# Patient Record
Sex: Female | Born: 1960
Health system: Southern US, Community
[De-identification: ages and names within clinical notes are randomized; demographics above are authoritative.]

## PROBLEM LIST (undated history)

## (undated) DIAGNOSIS — G629 Polyneuropathy, unspecified: Secondary | ICD-10-CM

## (undated) DIAGNOSIS — E7800 Pure hypercholesterolemia, unspecified: Secondary | ICD-10-CM

## (undated) DIAGNOSIS — E119 Type 2 diabetes mellitus without complications: Secondary | ICD-10-CM

## (undated) DIAGNOSIS — I10 Essential (primary) hypertension: Secondary | ICD-10-CM

## (undated) DIAGNOSIS — E039 Hypothyroidism, unspecified: Secondary | ICD-10-CM

## (undated) HISTORY — PX: BREAST BIOPSY: SHX20

## (undated) HISTORY — PX: GALLBLADDER SURGERY: SHX652

## (undated) HISTORY — PX: ABDOMINAL HYSTERECTOMY: SHX81

## (undated) HISTORY — PX: BACK SURGERY: SHX140

## (undated) HISTORY — PX: EYE SURGERY: SHX253

---

## 2005-09-15 ENCOUNTER — Encounter: Admission: RE | Admit: 2005-09-15 | Discharge: 2005-09-15 | Payer: Self-pay | Admitting: Internal Medicine

## 2005-10-26 ENCOUNTER — Encounter: Admission: RE | Admit: 2005-10-26 | Discharge: 2005-10-26 | Payer: Self-pay | Admitting: Internal Medicine

## 2006-01-02 ENCOUNTER — Emergency Department (HOSPITAL_COMMUNITY): Admission: EM | Admit: 2006-01-02 | Discharge: 2006-01-02 | Payer: Self-pay | Admitting: Emergency Medicine

## 2006-02-01 ENCOUNTER — Other Ambulatory Visit: Admission: RE | Admit: 2006-02-01 | Discharge: 2006-02-01 | Payer: Self-pay | Admitting: Obstetrics and Gynecology

## 2006-02-07 ENCOUNTER — Ambulatory Visit (HOSPITAL_COMMUNITY): Admission: RE | Admit: 2006-02-07 | Discharge: 2006-02-07 | Payer: Self-pay | Admitting: Obstetrics and Gynecology

## 2006-03-31 ENCOUNTER — Encounter: Admission: RE | Admit: 2006-03-31 | Discharge: 2006-03-31 | Payer: Self-pay | Admitting: Internal Medicine

## 2006-08-30 ENCOUNTER — Emergency Department (HOSPITAL_COMMUNITY): Admission: EM | Admit: 2006-08-30 | Discharge: 2006-08-30 | Payer: Self-pay | Admitting: Emergency Medicine

## 2006-12-14 ENCOUNTER — Encounter: Admission: RE | Admit: 2006-12-14 | Discharge: 2006-12-14 | Payer: Self-pay | Admitting: Obstetrics and Gynecology

## 2006-12-19 ENCOUNTER — Encounter (INDEPENDENT_AMBULATORY_CARE_PROVIDER_SITE_OTHER): Payer: Self-pay | Admitting: Obstetrics and Gynecology

## 2006-12-20 ENCOUNTER — Inpatient Hospital Stay (HOSPITAL_COMMUNITY): Admission: RE | Admit: 2006-12-20 | Discharge: 2006-12-21 | Payer: Self-pay | Admitting: Obstetrics and Gynecology

## 2008-07-04 ENCOUNTER — Encounter: Admission: RE | Admit: 2008-07-04 | Discharge: 2008-07-04 | Payer: Self-pay | Admitting: Internal Medicine

## 2009-01-07 ENCOUNTER — Ambulatory Visit (HOSPITAL_COMMUNITY): Admission: RE | Admit: 2009-01-07 | Discharge: 2009-01-07 | Payer: Self-pay | Admitting: Ophthalmology

## 2009-06-07 ENCOUNTER — Emergency Department (HOSPITAL_COMMUNITY): Admission: EM | Admit: 2009-06-07 | Discharge: 2009-06-07 | Payer: Self-pay | Admitting: Emergency Medicine

## 2009-08-04 ENCOUNTER — Encounter: Admission: RE | Admit: 2009-08-04 | Discharge: 2009-09-10 | Payer: Self-pay | Admitting: Internal Medicine

## 2010-04-19 ENCOUNTER — Encounter: Payer: Self-pay | Admitting: Internal Medicine

## 2010-06-22 LAB — RPR: RPR Ser Ql: NONREACTIVE

## 2010-06-22 LAB — WET PREP, GENITAL: Trich, Wet Prep: NONE SEEN

## 2010-08-11 NOTE — Op Note (Signed)
Sarah Gallegos, Sarah Gallegos            ACCOUNT NO.:  [id number]   MEDICAL RECORD NO.:  [id number]          PATIENT TYPE:  OIB   LOCATION:  9304                          FACILITY:  [facility]   PHYSICIAN:  Osborn Coho, M.D.   DATE OF BIRTH:  12/07/1960   DATE OF PROCEDURE:  12/19/2006  DATE OF DISCHARGE:                               OPERATIVE REPORT   PREOPERATIVE DIAGNOSES:  1. Menometrorrhagia.  2. Dysmenorrhea.  3. Fibroids.   POSTOPERATIVE DIAGNOSES:  1. Menometrorrhagia.  2. Dysmenorrhea.  3. Fibroids.   PROCEDURE:  Total laparoscopic hysterectomy and cystoscopy.   ATTENDING:  Osborn Coho, M.D.   ASSISTANT:  Jaymes Graff, M.D.   ANESTHESIA:  General.   FINDINGS:  Uterus weighing greater than 250 grams   SPECIMENS TO PATHOLOGY:  Uterus and cervix.   FLUID:  3400 mL.   URINE OUTPUT:  700 mL.   ESTIMATED BLOOD LOSS:  500 mL.   COMPLICATIONS:  None.   PROCEDURE:  The patient was taken to the operating room after risks,  benefits, alternatives reviewed with the patient.  The patient  verbalized understanding and consent signed and witnessed.  The patient  was placed under general anesthesia and prepped and draped in normal  sterile fashion.  A weighted speculum and vaginal wall retractors were  placed into the vagina and while the patient was in the dorsal lithotomy  position, the cervix was grasped with single-tooth tenaculum on the  anterior lip and the uterus sounded to 9 cm.  The size 8 probe was  placed on the Rumi and the large 4.0 cm Koh ring was used as well.  The  Rumi was placed and balloon insufflated and the occluder insufflated as  well with a total of approximately 60 mL by the end of the case.  The  initial insufflation was with 30 mL.  Attention was then turned to the  abdomen where a 10 mm incision was made at the umbilicus and using  OptiVu, trocar was advanced into the intra-abdominal cavity.  Pneumoperitoneum was achieved.  Attention was then  turned to left lower  quadrant where a 5 mm incision was made and 5 mm trocar advanced under  direct visualization.  The same was done in the right lower quadrant and  10 mm incision made in the right lower quadrant as well and 10 mm trocar  advanced under direct visualization.  All incisions were injected with  Marcaine prior to making the incision.  Using the harmonic, the round  ligament on the right was coagulated and cut and the bladder flap  created.  The fallopian tube and the utero-ovarian pedicles were  coagulated as well and cut down to the level of the round ligament.  The  same was done with the left round ligament and the left fallopian tube  and utero-ovarian pedicle.  After the left round ligament was cut on  that side, the bladder flap was created as well.  The bladder was  dissected away on the anterior lower uterine segment and cervix and this  Koh ring identified and with the Gyrus spatula, the anterior cul-de-sac  was entered and extended to the right.  With retraction, the uterine  vessels on the right side were then cauterized with the bipolar Gyrus  and cut and the remainder of the tissue on that side was excised down to  level of the Koh ring with the spatula and taken posteriorly as well to  just beyond the midline.  The same was done on the patient's left.  The  vaginal cuff was repaired with 0 PDS via interrupted stitches using  approximately five stitches.  Good hemostasis was noted.  Cystoscopy was  performed and after indigo carmine was administered and bilaterally  efflux was noted.  The bivalve speculum was then placed in the patient's  vagina and small portion in the midline of the and vaginal cuff was  noted to require stitch which was then placed after changing gloves and  returning to the top.  Zero PDS was placed in the midportion of the  vaginal cuff.  There was small portion of oozing at the left angle which  was made hemostatic with Gelfoam.   Pneumoperitoneum was relieved and  pneumoperitoneum introduced once again and good hemostasis remained at  that site.  All pedicles were noted to be hemostatic.  Prior to placing  the Gelfoam, the intra-abdominal cavity was copiously irrigated.  All  trocars removed under direct visualization.  Pneumoperitoneum was  relieved.  The umbilical, suprapubic and right lower quadrant incisions  were repaired with 0 Vicryl on the fascia at the umbilicus and as deep  as possible in the right lower quadrant and on the fascia at the  umbilicus.  The umbilical incision was made in the midportion of the  case while going around the [place] ring in order to place the uterine  morcellator in the suprapubic port.  The uterus was morcellated in order  to see posteriorly and that is when the remainder of the [facility] ring was  exposed posteriorly and the posterior cuff excised.  After doing that  the uterus was able to be placed into the vagina and helped to maintain  pneumoperitoneum.  After closure of the umbilical suprapubic fascia and  the deep tissue in the right lower quadrant 10 mm incision.  Actually  the suprapubic incision was extended to approximately 12 to 15 mm.  This  was done in order to introduce the uterine morcellator.  The 5 mm left  lower quadrant and right lower quadrant ports were repaired with  Dermabond.  And all incisions were covered with Dermabond as well.  A  sponge, lap and needle count was correct.  The patient tolerated  procedure well and was awaiting transfer to the recovery room in good  condition.      Osborn Coho, M.D.  Electronically Signed     AR/MEDQ  D:  12/19/2006  T:  12/20/2006  Job:  [id number]

## 2010-08-11 NOTE — H&P (Signed)
Sarah Gallegos, Sarah Gallegos            ACCOUNT NO.:  [id number]   MEDICAL RECORD NO.:  [id number]          PATIENT TYPE:  AMB   LOCATION:  SDC                           [facility]:  [facility]   PHYSICIAN:  Osborn Coho, M.D.   DATE OF BIRTH:  [birth year]   DATE OF ADMISSION:  DATE OF DISCHARGE:                              HISTORY & PHYSICAL   CHIEF COMPLAINT:  Heavy prolonged menses with pain.   HISTORY OF PRESENT ILLNESS:  A 50 year old gravida 3, para 3 with last  menstrual period November 18, 2006, with complaints of heavy prolonged  cycles and pain with cycles.  The patient desires definitive management  at this time with hysterectomy.  The patient previously had a vaginal  myomectomy in 2005 and has also tried hormonal management.  Several  alternatives were discussed with the patient, but the patient continued  to want definitive management with a hysterectomy.   PAST OBSTETRICS HISTORY:  Normal spontaneous vaginal delivery times  three, largest infant weighing 8 pounds and 6 ounces.   PAST GYNECOLOGICAL HISTORY:  History of regular menses.  Recently has  had a month or two of skipped cycles.  However, periods have  been  mainly prolonged, heavy and with pain.  She denies a history of  gonorrhea, chlamydia, and last mammogram of last year was within normal  limits.   PAST MEDICAL HISTORY:  1. Diabetes.  2. Hypertension.  3. Hypercholesterolemia.  4. Elevated BMI.  5. Hypothyroidism.   PAST SURGICAL HISTORY:  1. Bilateral tubal ligation.  2. Laparoscopic cholecystectomy.  3. Vaginal myomectomy.  4. Back surgery.  5. Per primary medical doctor, left breast biopsy.  6. Endometrial ablation in [place] with ThermaChoice II.   MEDICATIONS:  1. Lipitor 20 mg one p.o. daily.  2. Nexium 40 mg one p.o. daily.  3. Actos 45 mg one p.o. daily.  4. Lexapro 20 mg on p.o. daily.  5. Ibuprofen 800 mg one p.o. t.i.d. p.r.n.  6. Darvocet N-100 one p.o. q.12 h p.r.n.  7. Glucovance  5/500 two p.o. b.i.d.  8. Zyrtec 10 mg one p.o. daily.  9. Nasacort AQ two sprays each nostril daily.  10.Lisinopril 40 mg one p.o. daily.  11.Synthroid 0.2 mg one p.o. daily.  12.Byetta 10 mcg subcu twice daily.  13.Hydrochlorothiazide 12.5 mg one p.o. daily.   ALLERGIES:  No known drug allergies.  Denies Latex allergy.   SOCIAL HISTORY:  Denies cigarettes use, alcohol use and drug use.  She  lives with her husband, daughter and granddaughter.   FAMILY HISTORY:  Mother with diabetes and congestive heart failure.   REVIEW OF SYSTEMS:  Denies chest pain and shortness of breath with  activity.  She walks with a cane secondary to pain in her right knee  with osteoarthritis (and may end up with knee replacement surgery).  She  denies any recent fevers, chills, nausea, vomiting, diarrhea, GI or GU  problems.   PHYSICAL EXAMINATION:  VITAL SIGNS:  Blood pressure 140/80, weight 336,  height 5 feet 8 inches, BMI 51.  HEENT:  Within normal limits.  Thyroid not enlarged.  HEART:  Rate and rhythm regular.  CHEST:  Clear to auscultation bilaterally.  BREAST:  No masses, discharge, skin changes or nipple retraction.  No  CVA tenderness.  ABDOMEN:  Soft, nontender.  EXTREMITIES:  Within normal limits.  PELVIC:  External genitalia within normal limits.  Uterus difficult to  palpate secondary to habitus and no palpable pelvic masses.   STUDIES:  Ultrasound October 13, 2006:  Uterus 10.8 x 7.6 x 5.3 cm.  Left  ovary within normal limits, right ovary within normal limits.  Multiple  fibroids noted.  There is a mid body fibroid that appears submucosal  measuring 1.9 x 1.7 cm.  There is an anterior fibroid measuring 2.0 x  1.7 x 1.9 cm, another anterior fibroid measuring 2.0 x 1.6 x 1.8 cm and  a posterior fibroid measuring 2.0 x 1.7 x 1.7 cm.  TSH on September 05, 2006,  was 1.105 within normal limits and hematocrit was 41.6.   ASSESSMENT/PLAN:  Ms. Rutan is a 50 year old para 3 with  menometrorrhagia  and dysmenorrhea desiring definitive management with  hysterectomy.  The risks, benefits and alternatives have been discussed  at length with the patient including but not limited to bleeding,  infection and injury as well as increased risk with postop course  secondary to BMI.  Medical clearance has been obtained from her primary  medical doctor who considers her to be at mild to moderate risk for  perioperative complications and cleared her for surgery.  The patient's  questions were answered, and the patient is scheduled for a total  laparoscopic hysterectomy, possible LAVH, possible TAH on December 19, 2006.      Osborn Coho, M.D.  Electronically Signed     AR/MEDQ  D:  12/19/2006  T:  12/19/2006  Job:  [id number]

## 2010-08-14 NOTE — Discharge Summary (Signed)
Sarah Gallegos, Sarah Gallegos            ACCOUNT NO.:  [id number]   MEDICAL RECORD NO.:  [id number]          PATIENT TYPE:  INP   LOCATION:  9304                          [facility]   PHYSICIAN:  Osborn Coho, M.D.   DATE OF BIRTH:  December 16, 1960   DATE OF ADMISSION:  12/19/2006  DATE OF DISCHARGE:  12/21/2006                               DISCHARGE SUMMARY   DISCHARGE DIAGNOSES:  1. Symptomatic uterine fibroids.  2. Menometrorrhagia.  3. Dysmenorrhea.   OPERATION:  On the date of admission, the patient underwent a total  laparoscopic hysterectomy with cystoscopy, tolerating the procedure  well.  The patient had her uterus along with the cervix removed weighing  a total of 250 grams.   HISTORY OF PRESENT ILLNESS:  Sarah Gallegos is a 50 year old, female, para 3-  0-0-3, who presents for hysterectomy because of symptomatic uterine  fibroids, menometrorrhagia and dysmenorrhea.  Please see the patient's  dictated history and physical examination for details.   PREOPERATIVE PHYSICAL EXAMINATION:  Blood pressure 140/80, weight is 336  pounds, height 5 feet 8 inches tall, BMI is 51.  GENERAL EXAM:  Is within normal limits.  PELVIC EXAM:  External genitalia within normal limits.  Uterus was  difficult to palpate secondary to body habitus, and there were no  palpable masses.   HOSPITAL COURSE:  On the day of admission, the patient underwent  aforementioned procedures, tolerating them all well.  Postoperative  course was marked by the patient having an episode of chest pain.  The  patient's cardiac enzymes returned elevated; however, her troponin I was  negative.  By postoperative day #2, the patient's symptoms had resolved.  She was tolerating a regular diet and voiding without difficulty and  therefore deemed ready for discharge home.  Discharge hemoglobin 10  (preoperative hemoglobin 12.3) and discharge basic metabolic panel was  within normal limits with the exception of the patient having  an  elevated glucose of 178 (the patient does have non-insulin-dependent  diabetes).   DISCHARGE MEDICATIONS:  1. Motrin 600 mg with food every 6 hours as needed for pain.  2. Percocet 5/325 one to two every 4 hours as needed for moderate to      severe pain.   FOLLOW UP:  The patient is to call [facility] at [phone]  to schedule a 6 weeks' postoperative visit with Dr. Su Hilt.   DISCHARGE INSTRUCTIONS:  The patient was given a copy of [facility] postoperative instruction sheet.  She was also advised  to call with any questions.  The patient was further advised to avoid  driving for 2 weeks, heavy lifting for 6 weeks (nothing greater than 10-  15 pounds), no intercourse for 6 weeks, that she may shower and she  should increase her activities slowly.  Her diet should be a diabetic  diet.   FINAL PATHOLOGY:  Uterus hysterectomy:  Uterine cervix - benign  ectocervical and endocervical mucosa with chronic endocervicitis;  uterine corpus - benign proliferative endometrium and intramural  leiomyomata.      [place] J. Adline Peals.      Marylene Land  Su Hilt, M.D.  Electronically Signed    EJP/MEDQ  D:  01/12/2007  T:  01/13/2007  Job:  [id number]

## 2010-09-09 ENCOUNTER — Emergency Department (HOSPITAL_COMMUNITY)
Admission: EM | Admit: 2010-09-09 | Discharge: 2010-09-10 | Disposition: A | Discharge status: Home / Self Care | Payer: Medicare PPO | Attending: Emergency Medicine | Admitting: Emergency Medicine

## 2010-09-09 DIAGNOSIS — R632 Polyphagia: Secondary | ICD-10-CM | POA: Insufficient documentation

## 2010-09-09 DIAGNOSIS — H109 Unspecified conjunctivitis: Secondary | ICD-10-CM | POA: Insufficient documentation

## 2010-09-09 DIAGNOSIS — Z79899 Other long term (current) drug therapy: Secondary | ICD-10-CM | POA: Insufficient documentation

## 2010-09-09 DIAGNOSIS — R631 Polydipsia: Secondary | ICD-10-CM | POA: Insufficient documentation

## 2010-09-09 DIAGNOSIS — E039 Hypothyroidism, unspecified: Secondary | ICD-10-CM | POA: Insufficient documentation

## 2010-09-09 DIAGNOSIS — H5789 Other specified disorders of eye and adnexa: Secondary | ICD-10-CM | POA: Insufficient documentation

## 2010-09-09 DIAGNOSIS — M79609 Pain in unspecified limb: Secondary | ICD-10-CM | POA: Insufficient documentation

## 2010-09-09 DIAGNOSIS — E119 Type 2 diabetes mellitus without complications: Secondary | ICD-10-CM | POA: Insufficient documentation

## 2010-09-09 DIAGNOSIS — H11419 Vascular abnormalities of conjunctiva, unspecified eye: Secondary | ICD-10-CM | POA: Insufficient documentation

## 2010-09-09 DIAGNOSIS — I10 Essential (primary) hypertension: Secondary | ICD-10-CM | POA: Insufficient documentation

## 2010-09-09 DIAGNOSIS — K219 Gastro-esophageal reflux disease without esophagitis: Secondary | ICD-10-CM | POA: Insufficient documentation

## 2010-09-09 DIAGNOSIS — Z9889 Other specified postprocedural states: Secondary | ICD-10-CM | POA: Insufficient documentation

## 2010-09-09 LAB — POCT I-STAT, CHEM 8
BUN: 9 mg/dL (ref 6–23)
Calcium, Ion: 1.16 mmol/L (ref 1.12–1.32)
Chloride: 99 mEq/L (ref 96–112)
HCT: 40 % (ref 36.0–46.0)
TCO2: 26 mmol/L (ref 0–100)

## 2010-09-09 LAB — CBC
Hemoglobin: 12.6 g/dL (ref 12.0–15.0)
MCH: 26.8 pg (ref 26.0–34.0)
MCV: 79.8 fL (ref 78.0–100.0)
Platelets: 338 10*3/uL (ref 150–400)
RBC: 4.7 MIL/uL (ref 3.87–5.11)
RDW: 13.4 % (ref 11.5–15.5)
WBC: 11.3 10*3/uL — ABNORMAL HIGH (ref 4.0–10.5)

## 2010-09-09 LAB — CK TOTAL AND CKMB (NOT AT ARMC)
CK, MB: 1.9 ng/mL (ref 0.3–4.0)
Relative Index: INVALID (ref 0.0–2.5)
Total CK: 81 U/L (ref 7–177)

## 2010-09-09 LAB — DIFFERENTIAL
Eosinophils Absolute: 0.1 10*3/uL (ref 0.0–0.7)
Eosinophils Relative: 1 % (ref 0–5)
Lymphs Abs: 3.5 10*3/uL (ref 0.7–4.0)
Monocytes Absolute: 0.8 10*3/uL (ref 0.1–1.0)
Neutro Abs: 6.9 10*3/uL (ref 1.7–7.7)
Neutrophils Relative %: 60 % (ref 43–77)

## 2010-09-10 LAB — URINALYSIS, ROUTINE W REFLEX MICROSCOPIC
Bilirubin Urine: NEGATIVE
Glucose, UA: >1000 mg/dL — AB
Hgb urine dipstick: NEGATIVE
Specific Gravity, Urine: 1.038 — ABNORMAL HIGH (ref 1.005–1.030)
pH: 5.5 (ref 5.0–8.0)

## 2010-09-10 LAB — GLUCOSE, CAPILLARY: Glucose-Capillary: 282 mg/dL — ABNORMAL HIGH (ref 70–99)

## 2010-09-10 LAB — URINE MICROSCOPIC-ADD ON

## 2011-01-07 ENCOUNTER — Other Ambulatory Visit: Payer: Self-pay | Admitting: Internal Medicine

## 2011-01-07 DIAGNOSIS — Z1231 Encounter for screening mammogram for malignant neoplasm of breast: Secondary | ICD-10-CM

## 2011-01-07 LAB — CBC
HCT: 29.6 — ABNORMAL LOW
Hemoglobin: 10 — ABNORMAL LOW
Hemoglobin: 12.3
RBC: 3.62 — ABNORMAL LOW
RBC: 4.46
RDW: 15.8 — ABNORMAL HIGH
RDW: 15.9 — ABNORMAL HIGH

## 2011-01-07 LAB — BASIC METABOLIC PANEL
CO2: 26
Calcium: 8.3 — ABNORMAL LOW
Calcium: 9.1
Creatinine, Ser: 0.99
GFR calc Af Amer: >60
GFR calc Af Amer: >60
GFR calc non Af Amer: 60 — ABNORMAL LOW
GFR calc non Af Amer: >60
Glucose, Bld: 178 — ABNORMAL HIGH
Potassium: 3.9
Sodium: 133 — ABNORMAL LOW
Sodium: 136

## 2011-01-07 LAB — CARDIAC PANEL(CRET KIN+CKTOT+MB+TROPI)
CK, MB: 40.5 — ABNORMAL HIGH
CK, MB: 49.7 — ABNORMAL HIGH
Total CK: 5458 — ABNORMAL HIGH
Total CK: 6020 — ABNORMAL HIGH
Troponin I: 0.03

## 2011-01-14 LAB — CBC
MCHC: 32.8
MCV: 79.7
Platelets: 360
RBC: 5.07
RDW: 14.9 — ABNORMAL HIGH

## 2011-01-14 LAB — COMPREHENSIVE METABOLIC PANEL
ALT: 16
AST: 14
Albumin: 3.5
Calcium: 9.3
Creatinine, Ser: 0.84
GFR calc Af Amer: >60
GFR calc non Af Amer: >60
Sodium: 138
Total Protein: 7

## 2011-01-14 LAB — DIFFERENTIAL
Eosinophils Absolute: 0.2
Eosinophils Relative: 1
Lymphocytes Relative: 25
Lymphs Abs: 3.9 — ABNORMAL HIGH
Monocytes Relative: 3

## 2011-01-14 LAB — LIPASE, BLOOD: Lipase: 22

## 2011-02-16 ENCOUNTER — Ambulatory Visit
Admission: RE | Admit: 2011-02-16 | Discharge: 2011-02-16 | Disposition: A | Discharge status: Home / Self Care | Payer: Medicare PPO | Source: Ambulatory Visit | Attending: Internal Medicine | Admitting: Internal Medicine

## 2011-02-16 DIAGNOSIS — Z1231 Encounter for screening mammogram for malignant neoplasm of breast: Secondary | ICD-10-CM

## 2011-06-08 ENCOUNTER — Other Ambulatory Visit: Payer: Self-pay | Admitting: Orthopedic Surgery

## 2011-07-07 ENCOUNTER — Encounter (HOSPITAL_BASED_OUTPATIENT_CLINIC_OR_DEPARTMENT_OTHER): Admission: RE | Payer: Self-pay | Source: Ambulatory Visit

## 2011-07-07 ENCOUNTER — Ambulatory Visit (HOSPITAL_BASED_OUTPATIENT_CLINIC_OR_DEPARTMENT_OTHER): Admission: RE | Admit: 2011-07-07 | Payer: Medicare PPO | Source: Ambulatory Visit | Admitting: Orthopedic Surgery

## 2011-07-07 SURGERY — CARPAL TUNNEL RELEASE
Anesthesia: Regional | Laterality: Right

## 2012-05-17 ENCOUNTER — Other Ambulatory Visit: Payer: Self-pay | Admitting: Internal Medicine

## 2012-05-22 ENCOUNTER — Ambulatory Visit: Payer: Medicare PPO

## 2012-06-01 ENCOUNTER — Ambulatory Visit
Admission: RE | Admit: 2012-06-01 | Discharge: 2012-06-01 | Disposition: A | Discharge status: Home / Self Care | Payer: Medicare PPO | Source: Ambulatory Visit | Attending: Internal Medicine | Admitting: Internal Medicine

## 2012-06-01 DIAGNOSIS — Z1231 Encounter for screening mammogram for malignant neoplasm of breast: Secondary | ICD-10-CM

## 2012-08-09 ENCOUNTER — Other Ambulatory Visit: Payer: Self-pay | Admitting: Gastroenterology

## 2012-09-04 ENCOUNTER — Other Ambulatory Visit: Payer: Self-pay | Admitting: Internal Medicine

## 2012-09-04 DIAGNOSIS — N6320 Unspecified lump in the left breast, unspecified quadrant: Secondary | ICD-10-CM

## 2012-09-18 ENCOUNTER — Ambulatory Visit
Admission: RE | Admit: 2012-09-18 | Discharge: 2012-09-18 | Disposition: A | Discharge status: Home / Self Care | Payer: Medicare PPO | Source: Ambulatory Visit | Attending: Internal Medicine | Admitting: Internal Medicine

## 2012-09-18 DIAGNOSIS — N6320 Unspecified lump in the left breast, unspecified quadrant: Secondary | ICD-10-CM

## 2012-10-11 ENCOUNTER — Other Ambulatory Visit: Payer: Self-pay | Admitting: Gastroenterology

## 2013-01-14 ENCOUNTER — Encounter (HOSPITAL_COMMUNITY): Payer: Self-pay | Admitting: Emergency Medicine

## 2013-01-14 ENCOUNTER — Emergency Department (HOSPITAL_COMMUNITY)
Admission: EM | Admit: 2013-01-14 | Discharge: 2013-01-14 | Disposition: A | Discharge status: Home / Self Care | Payer: Medicare PPO | Attending: Emergency Medicine | Admitting: Emergency Medicine

## 2013-01-14 DIAGNOSIS — Z794 Long term (current) use of insulin: Secondary | ICD-10-CM | POA: Insufficient documentation

## 2013-01-14 DIAGNOSIS — E119 Type 2 diabetes mellitus without complications: Secondary | ICD-10-CM | POA: Insufficient documentation

## 2013-01-14 DIAGNOSIS — Z79899 Other long term (current) drug therapy: Secondary | ICD-10-CM | POA: Insufficient documentation

## 2013-01-14 DIAGNOSIS — Y929 Unspecified place or not applicable: Secondary | ICD-10-CM | POA: Insufficient documentation

## 2013-01-14 DIAGNOSIS — E7800 Pure hypercholesterolemia, unspecified: Secondary | ICD-10-CM | POA: Insufficient documentation

## 2013-01-14 DIAGNOSIS — I10 Essential (primary) hypertension: Secondary | ICD-10-CM | POA: Insufficient documentation

## 2013-01-14 DIAGNOSIS — Y939 Activity, unspecified: Secondary | ICD-10-CM | POA: Insufficient documentation

## 2013-01-14 DIAGNOSIS — S139XXA Sprain of joints and ligaments of unspecified parts of neck, initial encounter: Secondary | ICD-10-CM | POA: Insufficient documentation

## 2013-01-14 DIAGNOSIS — Z792 Long term (current) use of antibiotics: Secondary | ICD-10-CM | POA: Insufficient documentation

## 2013-01-14 DIAGNOSIS — M25519 Pain in unspecified shoulder: Secondary | ICD-10-CM | POA: Insufficient documentation

## 2013-01-14 DIAGNOSIS — Z8669 Personal history of other diseases of the nervous system and sense organs: Secondary | ICD-10-CM | POA: Insufficient documentation

## 2013-01-14 DIAGNOSIS — X58XXXA Exposure to other specified factors, initial encounter: Secondary | ICD-10-CM | POA: Insufficient documentation

## 2013-01-14 DIAGNOSIS — S161XXA Strain of muscle, fascia and tendon at neck level, initial encounter: Secondary | ICD-10-CM

## 2013-01-14 HISTORY — DX: Essential (primary) hypertension: I10

## 2013-01-14 HISTORY — DX: Type 2 diabetes mellitus without complications: E11.9

## 2013-01-14 HISTORY — DX: Pure hypercholesterolemia, unspecified: E78.00

## 2013-01-14 HISTORY — DX: Polyneuropathy, unspecified: G62.9

## 2013-01-14 MED ORDER — HYDROMORPHONE HCL PF 2 MG/ML IJ SOLN
2.0000 mg | Freq: Once | INTRAMUSCULAR | Status: AC
  Administered 2013-01-14: 2 mg via INTRAMUSCULAR
  Filled 2013-01-14: qty 1

## 2013-01-14 MED ORDER — ONDANSETRON 4 MG PO TBDP
8.0000 mg | ORAL_TABLET | Freq: Once | ORAL | Status: AC
  Administered 2013-01-14: 8 mg via ORAL
  Filled 2013-01-14: qty 2

## 2013-01-14 MED ORDER — DEXAMETHASONE SODIUM PHOSPHATE 10 MG/ML IJ SOLN
10.0000 mg | Freq: Once | INTRAMUSCULAR | Status: AC
  Administered 2013-01-14: 10 mg via INTRAMUSCULAR
  Filled 2013-01-14: qty 1

## 2013-01-14 MED ORDER — ORPHENADRINE CITRATE ER 100 MG PO TB12: 100.0000 mg | ORAL_TABLET | Freq: Two times a day (BID) | ORAL | Status: DC

## 2013-01-14 NOTE — ED Notes (Signed)
Presents with 2 days of left sided neck pain causing headache and tension. Unable to move head to the left. Pt is crying and visibly uncomfortable. Has an appointment with ortho doc wednesday.  Alert, oriented, MAEx4.

## 2013-01-14 NOTE — ED Notes (Signed)
After exiting another room, this RN was informed that patient left department without receiving discharge information.

## 2013-01-14 NOTE — ED Notes (Signed)
Patient has not returned, Assumed to have left.

## 2013-01-14 NOTE — ED Notes (Addendum)
Answered patient call light. Patient upset because MD entered and told her that she would be discharged approximately 1hr prior. Currently no D/C order is placed. MD informed.

## 2013-01-14 NOTE — ED Provider Notes (Signed)
CSN: [id number]     Arrival date & time 01/14/13  1747 History   First MD Initiated Contact with Patient 01/14/13 1759     Chief Complaint  Patient presents with  . Neck Pain   (Consider location/radiation/quality/duration/timing/severity/associated sxs/prior Treatment) HPI Comments: Patient presents a left-sided neck pain. She states that 2 days ago she woke up with what felt like a crick in her neck. She states over the last 2 days has gotten worse and feels like she's having muscle spasms in the left side of her neck. The pain is worse when she tries to turn her head. She denies any radiation of the pain down her arm. She denies any numbness or weakness in her arm. She states when the pain gets worse it shoots up toward her head. She denies any ongoing head pain no dizziness. She denies any neck injuries or recent falls. She denies any prior neck problems. She does see an orthopedist at [facility] orthoped[facility] for ongoing pain in her left shoulder. The shoulder pain is unrelated to the neck per the patient. She has an upcoming appointment with the orthopedist on Wednesday.  Patient is a 52 y.o. female presenting with neck pain.  Neck Pain Associated symptoms: no chest pain, no fever, no headaches, no numbness and no weakness     Past Medical History  Diagnosis Date  . Diabetes mellitus without complication   . Hypertension   . Hypercholesteremia   . Neuropathy    Past Surgical History  Procedure Laterality Date  . Back surgery     History reviewed. No pertinent family history. History  Substance Use Topics  . Smoking status: Never Smoker   . Smokeless tobacco: Not on file  . Alcohol Use: No   OB History   Grav Para Term Preterm Abortions TAB SAB Ect Mult Living                 Review of Systems  Constitutional: Negative for fever, chills, diaphoresis and fatigue.  HENT: Negative for congestion, rhinorrhea and sneezing.   Eyes: Negative.   Respiratory: Negative for cough,  chest tightness and shortness of breath.   Cardiovascular: Negative for chest pain and leg swelling.  Gastrointestinal: Negative for nausea, vomiting, abdominal pain, diarrhea and blood in stool.  Genitourinary: Negative for frequency, hematuria, flank pain and difficulty urinating.  Musculoskeletal: Positive for neck pain. Negative for arthralgias and back pain.  Skin: Negative for rash.  Neurological: Negative for dizziness, speech difficulty, weakness, numbness and headaches.    Allergies  Lantus  Home Medications   Current Outpatient Rx  Name  Route  Sig  Dispense  Refill  . Dapagliflozin Propanediol (FARXIGA) 5 MG TABS   Oral   Take 5 mg by mouth daily.         Marland Kitchen doxycycline (VIBRA-TABS) 100 MG tablet   Oral   Take 100 mg by mouth 2 (two) times daily.         Marland Kitchen gabapentin (NEURONTIN) 300 MG capsule   Oral   Take 300 mg by mouth 3 (three) times daily.         Marland Kitchen glyBURIDE-metformin (GLUCOVANCE) 5-500 MG per tablet   Oral   Take 1 tablet by mouth 2 (two) times daily with a meal.         . hydrochlorothiazide (MICROZIDE) 12.5 MG capsule   Oral   Take 12.5 mg by mouth daily.         Marland Kitchen HYDROcodone-acetaminophen (NORCO/VICODIN) 5-325 MG per  tablet   Oral   Take 1 tablet by mouth 2 (two) times daily.         . insulin lispro (HUMALOG) 100 UNIT/ML injection   Subcutaneous   Inject 20-30 Units into the skin 2 (two) times daily.         Marland Kitchen levothyroxine (SYNTHROID, LEVOTHROID) 200 MCG tablet   Oral   Take 200 mcg by mouth daily before breakfast.         . lisinopril (PRINIVIL,ZESTRIL) 40 MG tablet   Oral   Take 40 mg by mouth daily.         . simvastatin (ZOCOR) 20 MG tablet   Oral   Take 20 mg by mouth daily.         Marland Kitchen zolpidem (AMBIEN) 10 MG tablet   Oral   Take 10 mg by mouth at bedtime as needed for sleep.         . orphenadrine (NORFLEX) 100 MG tablet   Oral   Take 1 tablet (100 mg total) by mouth 2 (two) times daily.   20 tablet    0    BP 183/113  Pulse 95  Temp(Src) 98.4 F (36.9 C) (Oral)  Resp 18  SpO2 97% Physical Exam  Constitutional: She is oriented to person, place, and time. She appears well-developed and well-nourished.  HENT:  Head: Normocephalic and atraumatic.  Eyes: Pupils are equal, round, and reactive to light.  Neck:  Patient has muscle spasms in the left side of her neck. She has pain over the left sternocleidomastoid muscle in the left paraspinal muscles. She has no spinal tenderness along the cervical spine.  Cardiovascular: Normal rate, regular rhythm and normal heart sounds.   Pulmonary/Chest: Effort normal and breath sounds normal. No respiratory distress. She has no wheezes. She has no rales. She exhibits no tenderness.  Abdominal: Soft. Bowel sounds are normal. There is no tenderness. There is no rebound and no guarding.  Musculoskeletal: Normal range of motion. She exhibits no edema.  Lymphadenopathy:    She has no cervical adenopathy.  Neurological: She is alert and oriented to person, place, and time.  Patient has normal sensation and motor function in the left arm. She has normal pulses in the arms.  Skin: Skin is warm and dry. No rash noted.  Psychiatric: She has a normal mood and affect.    ED Course  Procedures (including critical care time) Labs Review Labs Reviewed - No data to display Imaging Review No results found.  EKG Interpretation   None       MDM   1. Neck strain, initial encounter    Patient neck pain. She has no bony tenderness. There's no neurologic deficits. Her pain was significantly improved with a shot of Dilaudid and Decadron. Given her diabetes I advised her she is going to keep a close eye on her blood sugars given that she was given a dose of steroids. She acknowledges this. She has appointment to follow up with orthopedist on Wednesday. She has Vicodin to take at home and I did give her prescription for Norflex as well.    Rolan Bucco,  MD 01/14/13 2100

## 2013-01-23 ENCOUNTER — Other Ambulatory Visit: Payer: Self-pay | Admitting: Orthopedic Surgery

## 2013-01-23 DIAGNOSIS — R531 Weakness: Secondary | ICD-10-CM

## 2013-01-23 DIAGNOSIS — M25512 Pain in left shoulder: Secondary | ICD-10-CM

## 2013-01-31 ENCOUNTER — Other Ambulatory Visit: Payer: Medicare Other

## 2013-02-06 ENCOUNTER — Ambulatory Visit
Admission: RE | Admit: 2013-02-06 | Discharge: 2013-02-06 | Disposition: A | Discharge status: Home / Self Care | Payer: Medicare PPO | Source: Ambulatory Visit | Attending: Orthopedic Surgery | Admitting: Orthopedic Surgery

## 2013-02-06 DIAGNOSIS — R531 Weakness: Secondary | ICD-10-CM

## 2013-02-06 DIAGNOSIS — M25512 Pain in left shoulder: Secondary | ICD-10-CM

## 2013-02-13 ENCOUNTER — Ambulatory Visit
Admission: RE | Admit: 2013-02-13 | Discharge: 2013-02-13 | Disposition: A | Discharge status: Home / Self Care | Payer: Medicare PPO | Source: Ambulatory Visit | Attending: Orthopedic Surgery | Admitting: Orthopedic Surgery

## 2013-07-02 ENCOUNTER — Other Ambulatory Visit: Payer: Self-pay

## 2013-07-02 DIAGNOSIS — Z1231 Encounter for screening mammogram for malignant neoplasm of breast: Secondary | ICD-10-CM

## 2013-07-26 ENCOUNTER — Encounter (INDEPENDENT_AMBULATORY_CARE_PROVIDER_SITE_OTHER): Payer: Self-pay

## 2013-07-26 ENCOUNTER — Ambulatory Visit
Admission: RE | Admit: 2013-07-26 | Discharge: 2013-07-26 | Disposition: A | Discharge status: Home / Self Care | Payer: Medicare PPO | Source: Ambulatory Visit

## 2013-07-26 DIAGNOSIS — Z1231 Encounter for screening mammogram for malignant neoplasm of breast: Secondary | ICD-10-CM

## 2013-08-27 ENCOUNTER — Other Ambulatory Visit (HOSPITAL_COMMUNITY): Payer: Self-pay | Admitting: Internal Medicine

## 2013-08-27 ENCOUNTER — Ambulatory Visit (HOSPITAL_COMMUNITY)
Admission: RE | Admit: 2013-08-27 | Discharge: 2013-08-27 | Disposition: A | Discharge status: Home / Self Care | Payer: Medicare PPO | Source: Ambulatory Visit | Attending: Internal Medicine | Admitting: Internal Medicine

## 2013-08-27 DIAGNOSIS — J209 Acute bronchitis, unspecified: Secondary | ICD-10-CM | POA: Insufficient documentation

## 2013-09-23 ENCOUNTER — Encounter (HOSPITAL_COMMUNITY): Payer: Self-pay | Admitting: Emergency Medicine

## 2013-09-23 ENCOUNTER — Emergency Department (HOSPITAL_COMMUNITY): Payer: Medicare PPO

## 2013-09-23 ENCOUNTER — Emergency Department (HOSPITAL_COMMUNITY)
Admission: EM | Admit: 2013-09-23 | Discharge: 2013-09-23 | Disposition: A | Discharge status: Home / Self Care | Payer: Medicare PPO | Attending: Emergency Medicine | Admitting: Emergency Medicine

## 2013-09-23 DIAGNOSIS — M7989 Other specified soft tissue disorders: Secondary | ICD-10-CM

## 2013-09-23 DIAGNOSIS — Z794 Long term (current) use of insulin: Secondary | ICD-10-CM | POA: Diagnosis not present

## 2013-09-23 DIAGNOSIS — R0602 Shortness of breath: Secondary | ICD-10-CM

## 2013-09-23 DIAGNOSIS — Z79899 Other long term (current) drug therapy: Secondary | ICD-10-CM | POA: Insufficient documentation

## 2013-09-23 DIAGNOSIS — R609 Edema, unspecified: Secondary | ICD-10-CM | POA: Diagnosis not present

## 2013-09-23 DIAGNOSIS — E119 Type 2 diabetes mellitus without complications: Secondary | ICD-10-CM | POA: Diagnosis not present

## 2013-09-23 DIAGNOSIS — E7800 Pure hypercholesterolemia, unspecified: Secondary | ICD-10-CM | POA: Diagnosis not present

## 2013-09-23 DIAGNOSIS — I10 Essential (primary) hypertension: Secondary | ICD-10-CM | POA: Diagnosis not present

## 2013-09-23 DIAGNOSIS — G609 Hereditary and idiopathic neuropathy, unspecified: Secondary | ICD-10-CM | POA: Insufficient documentation

## 2013-09-23 LAB — CBC WITH DIFFERENTIAL/PLATELET
BASOS ABS: 0.1 10*3/uL (ref 0.0–0.1)
Basophils Relative: 0 % (ref 0–1)
EOS ABS: 0.2 10*3/uL (ref 0.0–0.7)
EOS PCT: 2 % (ref 0–5)
HCT: 39.2 % (ref 36.0–46.0)
Hemoglobin: 12.6 g/dL (ref 12.0–15.0)
Lymphocytes Relative: 37 % (ref 12–46)
Lymphs Abs: 4.3 10*3/uL — ABNORMAL HIGH (ref 0.7–4.0)
MCH: 26.4 pg (ref 26.0–34.0)
MCHC: 32.1 g/dL (ref 30.0–36.0)
MCV: 82 fL (ref 78.0–100.0)
Monocytes Absolute: 0.7 10*3/uL (ref 0.1–1.0)
Monocytes Relative: 6 % (ref 3–12)
Neutro Abs: 6.3 10*3/uL (ref 1.7–7.7)
Neutrophils Relative %: 55 % (ref 43–77)
PLATELETS: 342 10*3/uL (ref 150–400)
RBC: 4.78 MIL/uL (ref 3.87–5.11)
RDW: 13.9 % (ref 11.5–15.5)
WBC: 11.5 10*3/uL — AB (ref 4.0–10.5)

## 2013-09-23 LAB — URINALYSIS, ROUTINE W REFLEX MICROSCOPIC
Bilirubin Urine: NEGATIVE
GLUCOSE, UA: NEGATIVE mg/dL
HGB URINE DIPSTICK: NEGATIVE
Ketones, ur: NEGATIVE mg/dL
Nitrite: NEGATIVE
Protein, ur: 30 mg/dL — AB
SPECIFIC GRAVITY, URINE: 1.016 (ref 1.005–1.030)
UROBILINOGEN UA: 0.2 mg/dL (ref 0.0–1.0)
pH: 5 (ref 5.0–8.0)

## 2013-09-23 LAB — COMPREHENSIVE METABOLIC PANEL
ALT: 15 U/L (ref 0–35)
AST: 13 U/L (ref 0–37)
Albumin: 3.4 g/dL — ABNORMAL LOW (ref 3.5–5.2)
Alkaline Phosphatase: 72 U/L (ref 39–117)
BUN: 13 mg/dL (ref 6–23)
CALCIUM: 9.4 mg/dL (ref 8.4–10.5)
CO2: 25 mEq/L (ref 19–32)
Chloride: 101 mEq/L (ref 96–112)
Creatinine, Ser: 0.8 mg/dL (ref 0.50–1.10)
GFR calc non Af Amer: 83 mL/min — ABNORMAL LOW (ref >90)
Glucose, Bld: 79 mg/dL (ref 70–99)
Potassium: 3.4 mEq/L — ABNORMAL LOW (ref 3.7–5.3)
Sodium: 141 mEq/L (ref 137–147)
TOTAL PROTEIN: 7.3 g/dL (ref 6.0–8.3)
Total Bilirubin: <0.2 mg/dL — ABNORMAL LOW (ref 0.3–1.2)

## 2013-09-23 LAB — URINE MICROSCOPIC-ADD ON

## 2013-09-23 LAB — PRO B NATRIURETIC PEPTIDE: Pro B Natriuretic peptide (BNP): 66.9 pg/mL (ref 0–125)

## 2013-09-23 LAB — TSH: TSH: 1.62 u[IU]/mL (ref 0.350–4.500)

## 2013-09-23 LAB — T4, FREE: FREE T4: 1.32 ng/dL (ref 0.80–1.80)

## 2013-09-23 MED ORDER — FUROSEMIDE 10 MG/ML IJ SOLN: 40.0000 mg | Freq: Once | INTRAMUSCULAR | Status: DC

## 2013-09-23 MED ORDER — FUROSEMIDE 10 MG/ML IJ SOLN
40.0000 mg | Freq: Once | INTRAMUSCULAR | Status: AC
  Administered 2013-09-23: 40 mg via INTRAMUSCULAR
  Filled 2013-09-23: qty 4

## 2013-09-23 MED ORDER — FLUCONAZOLE 100 MG PO TABS
150.0000 mg | ORAL_TABLET | Freq: Once | ORAL | Status: AC
  Administered 2013-09-23: 150 mg via ORAL
  Filled 2013-09-23: qty 2

## 2013-09-23 NOTE — Discharge Instructions (Signed)
Edema Follow up with the wellness center on Monday. Return to the ED if you develop chest pain, Shortness of breath or any other concerns. Edema is an abnormal buildup of fluids in your bodytissues. Edema is somewhatdependent on gravity to pull the fluid to the lowest place in your body. That makes the condition more common in the legs and thighs (lower extremities). Painless swelling of the feet and ankles is common and becomes more likely as you get older. It is also common in looser tissues, like around your eyes.  When the affected area is squeezed, the fluid may move out of that spot and leave a dent for a few moments. This dent is called pitting.  CAUSES  There are many possible causes of edema. Eating too much salt and being on your feet or sitting for a long time can cause edema in your legs and ankles. Hot weather may make edema worse. Common medical causes of edema include:  Heart failure.  Liver disease.  Kidney disease.  Weak blood vessels in your legs.  Cancer.  An injury.  Pregnancy.  Some medications.  Obesity. SYMPTOMS  Edema is usually painless.Your skin may look swollen or shiny.  DIAGNOSIS  Your health care provider may be able to diagnose edema by asking about your medical history and doing a physical exam. You may need to have tests such as X-rays, an electrocardiogram, or blood tests to check for medical conditions that may cause edema.  TREATMENT  Edema treatment depends on the cause. If you have heart, liver, or kidney disease, you need the treatment appropriate for these conditions. General treatment may include:  Elevation of the affected body part above the level of your heart.  Compression of the affected body part. Pressure from elastic bandages or support stockings squeezes the tissues and forces fluid back into the blood vessels. This keeps fluid from entering the tissues.  Restriction of fluid and salt intake.  Use of a water pill (diuretic).  These medications are appropriate only for some types of edema. They pull fluid out of your body and make you urinate more often. This gets rid of fluid and reduces swelling, but diuretics can have side effects. Only use diuretics as directed by your health care provider. HOME CARE INSTRUCTIONS   Keep the affected body part above the level of your heart when you are lying down.   Do not sit still or stand for prolonged periods.   Do not put anything directly under your knees when lying down.  Do not wear constricting clothing or garters on your upper legs.   Exercise your legs to work the fluid back into your blood vessels. This may help the swelling go down.   Wear elastic bandages or support stockings to reduce ankle swelling as directed by your health care provider.   Eat a low-salt diet to reduce fluid if your health care provider recommends it.   Only take medicines as directed by your health care provider. SEEK MEDICAL CARE IF:   Your edema is not responding to treatment.  You have heart, liver, or kidney disease and notice symptoms of edema.  You have edema in your legs that does not improve after elevating them.   You have sudden and unexplained weight gain. SEEK IMMEDIATE MEDICAL CARE IF:   You develop shortness of breath or chest pain.   You cannot breathe when you lie down.  You develop pain, redness, or warmth in the swollen areas.   You  have heart, liver, or kidney disease and suddenly get edema.  You have a fever and your symptoms suddenly get worse. MAKE SURE YOU:   Understand these instructions.  Will watch your condition.  Will get help right away if you are not doing well or get worse. Document Released: 03/15/2005 Document Revised: 03/20/2013 Document Reviewed: 01/05/2013 [facility] Patient Information 2015 [place], [place]. This information is not intended to replace advice given to you by your health care provider. Make sure you discuss any  questions you have with your health care provider.

## 2013-09-23 NOTE — ED Notes (Signed)
Pt has returned from the bathroom; placed back on monitor, continuous pulse oximetry and blood pressure cuff; urine collected and sent to lab for testing

## 2013-09-23 NOTE — ED Notes (Signed)
[place], MD at bedside. 

## 2013-09-23 NOTE — ED Notes (Signed)
Patient transported to X-ray 

## 2013-09-23 NOTE — ED Provider Notes (Signed)
CSN: [id number]     Arrival date & time 09/23/13  4854 History   First MD Initiated Contact with Patient 09/23/13 [phone]     Chief Complaint  Patient presents with  . Leg Swelling     (Consider location/radiation/quality/duration/timing/severity/associated sxs/prior Treatment) HPI Comments: Patient presents with a ten-day history of bilateral leg swelling is progressively worsening. The left feels worse than the right and is painful. She denies any chest pain or shortness of breath. She denies any fever or cough. Denies any history of heart failure. She has a history of diabetes, hypertension, hypothyroidism on Synthroid. States compliance with medications. She is on hydrochlorothiazide. She feels like she is having worsening diabetic neuropathy pain in her legs. Denies abdominal pain or back pain. She did take a recent car trip to [place].  The history is provided by the patient.    Past Medical History  Diagnosis Date  . Diabetes mellitus without complication   . Hypertension   . Hypercholesteremia   . Neuropathy    Past Surgical History  Procedure Laterality Date  . Back surgery     No family history on file. History  Substance Use Topics  . Smoking status: Never Smoker   . Smokeless tobacco: Not on file  . Alcohol Use: No   OB History   Grav Para Term Preterm Abortions TAB SAB Ect Mult Living                 Review of Systems  Constitutional: Negative for fever, activity change and appetite change.  HENT: Negative for congestion and rhinorrhea.   Respiratory: Negative for cough, chest tightness and shortness of breath.   Cardiovascular: Positive for leg swelling. Negative for chest pain.  Gastrointestinal: Negative for nausea, vomiting and abdominal pain.  Genitourinary: Negative for dysuria and hematuria.  Musculoskeletal: Positive for arthralgias and myalgias. Negative for joint swelling.  Skin: Negative for rash.  Neurological: Negative for dizziness, weakness  and headaches.  A complete 10 system review of systems was obtained and all systems are negative except as noted in the HPI and PMH.      Allergies  Lantus and Tradjenta  Home Medications   Prior to Admission medications   Medication Sig Start Date End Date Taking? Authorizing Provider  cycloSPORINE (RESTASIS) 0.05 % ophthalmic emulsion Place 1 drop into both eyes daily as needed (dry eyes).   Yes Historical Provider, MD  Dapagliflozin Propanediol (FARXIGA) 5 MG TABS Take 5 mg by mouth daily.   Yes Historical Provider, MD  gabapentin (NEURONTIN) 300 MG capsule Take 300 mg by mouth 3 (three) times daily.   Yes Historical Provider, MD  glyBURIDE-metformin (GLUCOVANCE) 5-500 MG per tablet Take 2 tablets by mouth 2 (two) times daily with a meal.    Yes Historical Provider, MD  hydrochlorothiazide (MICROZIDE) 12.5 MG capsule Take 12.5 mg by mouth daily.   Yes Historical Provider, MD  HYDROcodone-acetaminophen (NORCO/VICODIN) 5-325 MG per tablet Take 1 tablet by mouth 2 (two) times daily as needed for moderate pain.    Yes Historical Provider, MD  Insulin Lispro Prot & Lispro (HUMALOG MIX 75/25 KWIKPEN) (75-25) 100 UNIT/ML Kwikpen Inject 20-30 Units into the skin 2 (two) times daily. 30 units and 20 units at night   Yes Historical Provider, MD  levothyroxine (SYNTHROID, LEVOTHROID) 200 MCG tablet Take 200 mcg by mouth daily before breakfast.   Yes Historical Provider, MD  lisinopril (PRINIVIL,ZESTRIL) 40 MG tablet Take 40 mg by mouth daily.   Yes Historical  Provider, MD  naproxen sodium (ANAPROX) 220 MG tablet Take 440 mg by mouth daily as needed (pain).   Yes Historical Provider, MD  simvastatin (ZOCOR) 20 MG tablet Take 20 mg by mouth daily.   Yes Historical Provider, MD  zolpidem (AMBIEN) 10 MG tablet Take 10 mg by mouth at bedtime as needed for sleep.   Yes Historical Provider, MD  insulin lispro (HUMALOG) 100 UNIT/ML injection Inject 20-30 Units into the skin 2 (two) times daily.     Historical Provider, MD   BP 141/64  Pulse 73  Temp(Src) 98.5 F (36.9 C) (Oral)  Resp 20  Ht 5\' 8"  (1.727 m)  Wt 287 lb (130.182 kg)  BMI 43.65 kg/m2  SpO2 100% Physical Exam  Nursing note and vitals reviewed. Constitutional: She is oriented to person, place, and time. She appears well-developed and well-nourished. No distress.  HENT:  Head: Normocephalic and atraumatic.  Mouth/Throat: Oropharynx is clear and moist. No oropharyngeal exudate.  Eyes: Conjunctivae and EOM are normal. Pupils are equal, round, and reactive to light.  Neck: Normal range of motion. Neck supple.  No meningismus.  Cardiovascular: Normal rate, regular rhythm, normal heart sounds and intact distal pulses.   No murmur heard. Pulmonary/Chest: Effort normal and breath sounds normal. No respiratory distress. She has no rales.  Abdominal: Soft. There is no tenderness. There is no rebound and no guarding.  Musculoskeletal: Normal range of motion. She exhibits edema. She exhibits no tenderness.  +2 pretibial pitting edema to knees bilaterally. Unable to palpate pulses. +2 DP pulses with doppler. No calf asymmetry  Neurological: She is alert and oriented to person, place, and time. No cranial nerve deficit. She exhibits normal muscle tone. Coordination normal.  No ataxia on finger to nose bilaterally. No pronator drift. 5/5 strength throughout. CN 2-12 intact. Negative Romberg. Equal grip strength. Sensation intact. Gait is normal.   Skin: Skin is warm.  Psychiatric: She has a normal mood and affect. Her behavior is normal.    ED Course  Procedures (including critical care time) Labs Review Labs Reviewed  CBC WITH DIFFERENTIAL - Abnormal; Notable for the following:    WBC 11.5 (*)    Lymphs Abs 4.3 (*)    All other components within normal limits  COMPREHENSIVE METABOLIC PANEL - Abnormal; Notable for the following:    Potassium 3.4 (*)    Albumin 3.4 (*)    Total Bilirubin <0.2 (*)    GFR calc non Af  Amer 83 (*)    All other components within normal limits  URINALYSIS, ROUTINE W REFLEX MICROSCOPIC - Abnormal; Notable for the following:    Protein, ur 30 (*)    Leukocytes, UA SMALL (*)    All other components within normal limits  URINE MICROSCOPIC-ADD ON - Abnormal; Notable for the following:    Squamous Epithelial / LPF FEW (*)    Bacteria, UA FEW (*)    All other components within normal limits  PRO B NATRIURETIC PEPTIDE  TSH  T4, FREE    Imaging Review Dg Chest 2 View  09/23/2013   CLINICAL DATA:  LEG SWELLING  EXAM: CHEST - 2 VIEW  COMPARISON:  08/27/2013  FINDINGS: Persistent perihilar interstitial prominence. No focal airspace disease. Heart size normal. Vascular clips in the upper abdomen. No effusion. Visualized skeletal structures are unremarkable.  IMPRESSION: No acute cardiopulmonary disease.   Electronically Signed   By: Arne Cleveland M.D.   On: 09/23/2013 11:08     EKG Interpretation  Date/Time:  Sunday September 23 2013 10:08:06 EDT Ventricular Rate:  69 PR Interval:  171 QRS Duration: 86 QT Interval:  406 QTC Calculation: 435 R Axis:   -22 Text Interpretation:  Sinus rhythm Borderline left axis deviation  Nonspecific T abnormalities, lateral leads lateral T wave inversions  Confirmed by Makensey Rego  MD, Esker Dever ([id number]) on 09/23/2013 10:21:57 AM      MDM   Final diagnoses:  Leg swelling   Leg swelling for the past 10 days. No chest pain or shortness of breath. History of hypothyroidism, no history of CHF.  02 saturation 100%. Lungs are clear to auscultation. Will obtain dopplers given recent car travel.   Dopplers were negative for DVT. Chest x-ray negative. Kidney function and liver function normal. TSH within normal limits.  Patient given dose of IV Lasix in ED. She is ambulate without desaturation. She stable for followup as an outpatient with recheck of her leg edema. Patient instructed her TSH is normal but thyroid hormone is still pending. She will  follow with her Dr. this week for adjustment of her diuretics. No evidence of CHF exacerbation. No hypoxia  BP 141/64  Pulse 73  Temp(Src) 98.5 F (36.9 C) (Oral)  Resp 20  Ht 5\' 8"  (1.727 m)  Wt 287 lb (130.182 kg)  BMI 43.65 kg/m2  SpO2 100%   Ezequiel Essex, MD 09/23/13 1607

## 2013-09-23 NOTE — ED Notes (Signed)
Pt ambulated well stating 97% - 98%RA with a heart rate of 86 - 88bpm without difficulty or distress

## 2013-09-23 NOTE — ED Notes (Signed)
Pt still out of the department at this time for testing

## 2013-09-23 NOTE — ED Notes (Signed)
Pt given a [place] sandwich and a cup of ice with 2 cranberry juices to drink; Abigail Butts, RN in room with pt

## 2013-09-23 NOTE — Progress Notes (Signed)
VASCULAR LAB PRELIMINARY  PRELIMINARY  PRELIMINARY  PRELIMINARY  Bilateral lower extremity venous Dopplers completed.    Preliminary report:  There is no DVT or SVT noted in the bilateral lower extremities.   KANADY, CANDACE, RVT 09/23/2013, 9:45 AM

## 2013-09-23 NOTE — ED Notes (Signed)
Pt aware of need of urine specimen; pt up ambulatory at this time to attempt to provide an urine specimen

## 2013-09-23 NOTE — ED Notes (Signed)
Pt has returned from being out of the department; pt placed back on monitor, continuous pulse oximetry and blood pressure cuff; EKG performed

## 2013-09-23 NOTE — ED Notes (Signed)
Pt c/o bil leg swelling onset June 18, worsening with pain,pt reports burning sensation bil lower extremities, denies CP, pt c/o intermittent SOB worse with activity, A&O x4

## 2014-04-15 ENCOUNTER — Ambulatory Visit
Admission: RE | Admit: 2014-04-15 | Discharge: 2014-04-15 | Disposition: A | Discharge status: Home / Self Care | Payer: Medicare PPO | Source: Ambulatory Visit | Attending: Internal Medicine | Admitting: Internal Medicine

## 2014-04-15 ENCOUNTER — Other Ambulatory Visit: Payer: Self-pay | Admitting: Internal Medicine

## 2014-04-15 DIAGNOSIS — J209 Acute bronchitis, unspecified: Secondary | ICD-10-CM

## 2014-06-07 DIAGNOSIS — I10 Essential (primary) hypertension: Secondary | ICD-10-CM | POA: Diagnosis not present

## 2014-06-07 DIAGNOSIS — E784 Other hyperlipidemia: Secondary | ICD-10-CM | POA: Diagnosis not present

## 2014-06-07 DIAGNOSIS — E1140 Type 2 diabetes mellitus with diabetic neuropathy, unspecified: Secondary | ICD-10-CM | POA: Diagnosis not present

## 2014-06-07 DIAGNOSIS — L84 Corns and callosities: Secondary | ICD-10-CM | POA: Diagnosis not present

## 2014-07-31 ENCOUNTER — Emergency Department (HOSPITAL_COMMUNITY)
Admission: EM | Admit: 2014-07-31 | Discharge: 2014-07-31 | Disposition: A | Discharge status: Home / Self Care | Payer: Medicare PPO | Attending: Emergency Medicine | Admitting: Emergency Medicine

## 2014-07-31 ENCOUNTER — Emergency Department (HOSPITAL_COMMUNITY): Payer: Medicare PPO

## 2014-07-31 ENCOUNTER — Encounter (HOSPITAL_COMMUNITY): Payer: Self-pay | Admitting: Emergency Medicine

## 2014-07-31 DIAGNOSIS — J189 Pneumonia, unspecified organism: Secondary | ICD-10-CM

## 2014-07-31 DIAGNOSIS — Z79899 Other long term (current) drug therapy: Secondary | ICD-10-CM | POA: Insufficient documentation

## 2014-07-31 DIAGNOSIS — Z794 Long term (current) use of insulin: Secondary | ICD-10-CM | POA: Diagnosis not present

## 2014-07-31 DIAGNOSIS — R110 Nausea: Secondary | ICD-10-CM | POA: Insufficient documentation

## 2014-07-31 DIAGNOSIS — E780 Pure hypercholesterolemia: Secondary | ICD-10-CM | POA: Diagnosis not present

## 2014-07-31 DIAGNOSIS — I10 Essential (primary) hypertension: Secondary | ICD-10-CM | POA: Insufficient documentation

## 2014-07-31 DIAGNOSIS — Z8669 Personal history of other diseases of the nervous system and sense organs: Secondary | ICD-10-CM | POA: Diagnosis not present

## 2014-07-31 DIAGNOSIS — J159 Unspecified bacterial pneumonia: Secondary | ICD-10-CM | POA: Diagnosis not present

## 2014-07-31 DIAGNOSIS — R079 Chest pain, unspecified: Secondary | ICD-10-CM | POA: Diagnosis present

## 2014-07-31 DIAGNOSIS — E119 Type 2 diabetes mellitus without complications: Secondary | ICD-10-CM | POA: Insufficient documentation

## 2014-07-31 LAB — CBC WITH DIFFERENTIAL/PLATELET
Basophils Absolute: 0 10*3/uL (ref 0.0–0.1)
Basophils Relative: 0 % (ref 0–1)
Eosinophils Absolute: 0.2 10*3/uL (ref 0.0–0.7)
Eosinophils Relative: 1 % (ref 0–5)
HCT: 41.5 % (ref 36.0–46.0)
Hemoglobin: 13.8 g/dL (ref 12.0–15.0)
Lymphocytes Relative: 16 % (ref 12–46)
Lymphs Abs: 2 10*3/uL (ref 0.7–4.0)
MCH: 27.4 pg (ref 26.0–34.0)
MCHC: 33.3 g/dL (ref 30.0–36.0)
MCV: 82.5 fL (ref 78.0–100.0)
Monocytes Absolute: 0.9 10*3/uL (ref 0.1–1.0)
Monocytes Relative: 7 % (ref 3–12)
Neutro Abs: 9.5 10*3/uL — ABNORMAL HIGH (ref 1.7–7.7)
Neutrophils Relative %: 76 % (ref 43–77)
Platelets: 337 10*3/uL (ref 150–400)
RBC: 5.03 MIL/uL (ref 3.87–5.11)
RDW: 13.7 % (ref 11.5–15.5)
WBC: 12.6 10*3/uL — ABNORMAL HIGH (ref 4.0–10.5)

## 2014-07-31 LAB — BASIC METABOLIC PANEL
Anion gap: 13 (ref 5–15)
BUN: 17 mg/dL (ref 6–20)
CO2: 26 mmol/L (ref 22–32)
Calcium: 9.5 mg/dL (ref 8.9–10.3)
Chloride: 94 mmol/L — ABNORMAL LOW (ref 101–111)
Creatinine, Ser: 1.14 mg/dL — ABNORMAL HIGH (ref 0.44–1.00)
GFR calc Af Amer: >60 mL/min (ref >60)
GFR calc non Af Amer: 54 mL/min — ABNORMAL LOW (ref >60)
Glucose, Bld: 404 mg/dL — ABNORMAL HIGH (ref 70–99)
Potassium: 3.7 mmol/L (ref 3.5–5.1)
Sodium: 133 mmol/L — ABNORMAL LOW (ref 135–145)

## 2014-07-31 LAB — URINALYSIS, ROUTINE W REFLEX MICROSCOPIC
Bilirubin Urine: NEGATIVE
Glucose, UA: >1000 mg/dL — AB
Hgb urine dipstick: NEGATIVE
Ketones, ur: NEGATIVE mg/dL
Leukocytes, UA: NEGATIVE
Nitrite: NEGATIVE
Protein, ur: NEGATIVE mg/dL
Specific Gravity, Urine: 1.037 — ABNORMAL HIGH (ref 1.005–1.030)
Urobilinogen, UA: 0.2 mg/dL (ref 0.0–1.0)
pH: 5.5 (ref 5.0–8.0)

## 2014-07-31 LAB — URINE MICROSCOPIC-ADD ON

## 2014-07-31 LAB — I-STAT CG4 LACTIC ACID, ED: Lactic Acid, Venous: 1.54 mmol/L (ref 0.5–2.0)

## 2014-07-31 MED ORDER — SODIUM CHLORIDE 0.9 % IV BOLUS (SEPSIS)
500.0000 mL | Freq: Once | INTRAVENOUS | Status: AC
  Administered 2014-07-31: 500 mL via INTRAVENOUS

## 2014-07-31 MED ORDER — LEVOFLOXACIN 750 MG PO TABS: 750.0000 mg | ORAL_TABLET | Freq: Every day | ORAL | Status: DC

## 2014-07-31 MED ORDER — AEROCHAMBER PLUS FLO-VU MEDIUM MISC
1.0000 | Freq: Once | Status: AC
  Administered 2014-07-31: 1
  Filled 2014-07-31: qty 1

## 2014-07-31 MED ORDER — ONDANSETRON HCL 4 MG/2ML IJ SOLN
4.0000 mg | Freq: Once | INTRAMUSCULAR | Status: AC
  Administered 2014-07-31: 4 mg via INTRAVENOUS
  Filled 2014-07-31: qty 2

## 2014-07-31 MED ORDER — GUAIFENESIN ER 1200 MG PO TB12: 1.0000 | ORAL_TABLET | Freq: Two times a day (BID) | ORAL | Status: DC

## 2014-07-31 MED ORDER — ACETAMINOPHEN-CODEINE 120-12 MG/5ML PO SOLN: 10.0000 mL | ORAL | Status: DC | PRN

## 2014-07-31 MED ORDER — IPRATROPIUM-ALBUTEROL 0.5-2.5 (3) MG/3ML IN SOLN
3.0000 mL | Freq: Once | RESPIRATORY_TRACT | Status: AC
  Administered 2014-07-31: 3 mL via RESPIRATORY_TRACT
  Filled 2014-07-31: qty 3

## 2014-07-31 MED ORDER — ALBUTEROL SULFATE HFA 108 (90 BASE) MCG/ACT IN AERS
2.0000 | INHALATION_SPRAY | RESPIRATORY_TRACT | Status: DC | PRN
  Administered 2014-07-31: 2 via RESPIRATORY_TRACT
  Filled 2014-07-31: qty 6.7

## 2014-07-31 MED ORDER — LEVOFLOXACIN IN D5W 750 MG/150ML IV SOLN
750.0000 mg | Freq: Once | INTRAVENOUS | Status: AC
  Administered 2014-07-31: 750 mg via INTRAVENOUS
  Filled 2014-07-31: qty 150

## 2014-07-31 NOTE — Discharge Instructions (Signed)
Return here as needed.  Rest as much as possible.  Follow-up with your primary care doctor, increase your fluid intake

## 2014-07-31 NOTE — ED Notes (Signed)
Ambulated Pt with Pulse Ox. Pulse Ox at rest while in Bed was 99%. Stood patient beside the bed Pulse Ox was 99%. While Ambulating Pt in Hallway Pt's Pulse Ox went down to 91%. Pt stated she felt weak and dizzy. Pt's Pulse Ox stayed around 91%-93% while walking back to the Room. Pt placed back in the bed.

## 2014-07-31 NOTE — ED Notes (Signed)
Pt c/o chest pain with breathing and coughing, and body aches, chills sore throat. Pt temp 100.9. Pt denies vomiting diarrhea. Pt sts she has been nauseated.

## 2014-07-31 NOTE — ED Provider Notes (Signed)
CSN: [id number]     Arrival date & time 07/31/14  0716 History   First MD Initiated Contact with Patient 07/31/14 [phone]     Chief Complaint  Patient presents with  . Chest Pain     (Consider location/radiation/quality/duration/timing/severity/associated sxs/prior Treatment) HPI Patient presents to the emergency department with cough, chest pain with deep breathing and body aches.  The patient, states she has also had fevers.  The patient denies headache, blurred vision, weakness, dizziness, back pain, neck pain, runny nose, abdominal pain, vomiting, diarrhea, dysuria, incontinence, bloody stool, hematuria, or syncope.  The patient states she did not take any medications prior to arrival.  Patient states nothing seems make her condition better or worse.  She also has had some mild nausea associated with her symptoms Past Medical History  Diagnosis Date  . Diabetes mellitus without complication   . Hypertension   . Hypercholesteremia   . Neuropathy    Past Surgical History  Procedure Laterality Date  . Back surgery     History reviewed. No pertinent family history. History  Substance Use Topics  . Smoking status: Never Smoker   . Smokeless tobacco: Not on file  . Alcohol Use: No   OB History    No data available     Review of Systems All other systems negative except as documented in the HPI. All pertinent positives and negatives as reviewed in the HPI.   Allergies  Lantus and Tradjenta  Home Medications   Prior to Admission medications   Medication Sig Start Date End Date Taking? Authorizing Provider  cycloSPORINE (RESTASIS) 0.05 % ophthalmic emulsion Place 1 drop into both eyes daily as needed (dry eyes).    Historical Provider, MD  Dapagliflozin Propanediol (FARXIGA) 5 MG TABS Take 5 mg by mouth daily.    Historical Provider, MD  gabapentin (NEURONTIN) 300 MG capsule Take 300 mg by mouth 3 (three) times daily.    Historical Provider, MD  glyBURIDE-metformin (GLUCOVANCE)  5-500 MG per tablet Take 2 tablets by mouth 2 (two) times daily with a meal.     Historical Provider, MD  hydrochlorothiazide (MICROZIDE) 12.5 MG capsule Take 12.5 mg by mouth daily.    Historical Provider, MD  HYDROcodone-acetaminophen (NORCO/VICODIN) 5-325 MG per tablet Take 1 tablet by mouth 2 (two) times daily as needed for moderate pain.     Historical Provider, MD  insulin lispro (HUMALOG) 100 UNIT/ML injection Inject 20-30 Units into the skin 2 (two) times daily.    Historical Provider, MD  Insulin Lispro Prot & Lispro (HUMALOG MIX 75/25 KWIKPEN) (75-25) 100 UNIT/ML Kwikpen Inject 20-30 Units into the skin 2 (two) times daily. 30 units and 20 units at night    Historical Provider, MD  levothyroxine (SYNTHROID, LEVOTHROID) 200 MCG tablet Take 200 mcg by mouth daily before breakfast.    Historical Provider, MD  lisinopril (PRINIVIL,ZESTRIL) 40 MG tablet Take 40 mg by mouth daily.    Historical Provider, MD  naproxen sodium (ANAPROX) 220 MG tablet Take 440 mg by mouth daily as needed (pain).    Historical Provider, MD  simvastatin (ZOCOR) 20 MG tablet Take 20 mg by mouth daily.    Historical Provider, MD  zolpidem (AMBIEN) 10 MG tablet Take 10 mg by mouth at bedtime as needed for sleep.    Historical Provider, MD   BP 117/75 mmHg  Temp(Src) 100.9 F (38.3 C) (Oral)  Resp 20  SpO2 98% Physical Exam  Constitutional: She is oriented to person, place, and time. She  appears well-developed and well-nourished. No distress.  HENT:  Head: Normocephalic and atraumatic.  Mouth/Throat: Oropharynx is clear and moist.  Eyes: Pupils are equal, round, and reactive to light.  Neck: Normal range of motion. Neck supple.  Cardiovascular: Normal rate, regular rhythm and normal heart sounds.  Exam reveals no gallop and no friction rub.   No murmur heard. Pulmonary/Chest: Effort normal and breath sounds normal. No respiratory distress. She has no wheezes.  Abdominal: Soft. Bowel sounds are normal. She  exhibits no distension. There is no tenderness.  Musculoskeletal: She exhibits no edema.  Neurological: She is alert and oriented to person, place, and time. She exhibits normal muscle tone. Coordination normal.  Skin: Skin is warm. No rash noted. No erythema.  Psychiatric: She has a normal mood and affect. Her behavior is normal.  Nursing note and vitals reviewed.   ED Course  Procedures (including critical care time) Labs Review Labs Reviewed  BASIC METABOLIC PANEL  CBC WITH DIFFERENTIAL/PLATELET    Imaging Review No results found.   EKG Interpretation   Date/Time:  Wednesday Jul 31 2014 07:25:18 EDT Ventricular Rate:  99 PR Interval:  155 QRS Duration: 86 QT Interval:  354 QTC Calculation: 454 R Axis:   -32 Text Interpretation:  Sinus rhythm Abnormal R-wave progression, late  transition Probable left ventricular hypertrophy Confirmed by Jeneen Rinks  MD,  [place] ([id number]) on 07/31/2014 7:29:28 AM      MDM   Final diagnoses:  None   The patient will be treated for possible early pneumonia based on the fact that she has cough and pain with coughing and she has a fever and she is diabetic with hypertension and high cholesterol.  The patient is advised follow-up with her primary care Dr. told to return here as needed.  She has been given IV fluids and told to increase her fluid intake, rest as much as possible   Dalia Heading, PA-C 07/31/14 [place], MD 08/04/14 [phone]

## 2014-07-31 NOTE — ED Notes (Signed)
Patient transported to X-ray 

## 2014-08-08 DIAGNOSIS — M5126 Other intervertebral disc displacement, lumbar region: Secondary | ICD-10-CM | POA: Diagnosis not present

## 2014-08-08 DIAGNOSIS — E1140 Type 2 diabetes mellitus with diabetic neuropathy, unspecified: Secondary | ICD-10-CM | POA: Diagnosis not present

## 2014-08-08 DIAGNOSIS — J189 Pneumonia, unspecified organism: Secondary | ICD-10-CM | POA: Diagnosis not present

## 2014-08-08 DIAGNOSIS — I10 Essential (primary) hypertension: Secondary | ICD-10-CM | POA: Diagnosis not present

## 2014-08-08 DIAGNOSIS — Z6841 Body Mass Index (BMI) 40.0 and over, adult: Secondary | ICD-10-CM | POA: Diagnosis not present

## 2014-08-29 DIAGNOSIS — E11329 Type 2 diabetes mellitus with mild nonproliferative diabetic retinopathy without macular edema: Secondary | ICD-10-CM | POA: Diagnosis not present

## 2014-08-29 DIAGNOSIS — H25013 Cortical age-related cataract, bilateral: Secondary | ICD-10-CM | POA: Diagnosis not present

## 2014-08-29 DIAGNOSIS — H16223 Keratoconjunctivitis sicca, not specified as Sjogren's, bilateral: Secondary | ICD-10-CM | POA: Diagnosis not present

## 2014-09-20 DIAGNOSIS — N760 Acute vaginitis: Secondary | ICD-10-CM | POA: Diagnosis not present

## 2014-09-20 DIAGNOSIS — J019 Acute sinusitis, unspecified: Secondary | ICD-10-CM | POA: Diagnosis not present

## 2014-09-20 DIAGNOSIS — Z6841 Body Mass Index (BMI) 40.0 and over, adult: Secondary | ICD-10-CM | POA: Diagnosis not present

## 2014-09-20 DIAGNOSIS — I10 Essential (primary) hypertension: Secondary | ICD-10-CM | POA: Diagnosis not present

## 2014-09-20 DIAGNOSIS — E1140 Type 2 diabetes mellitus with diabetic neuropathy, unspecified: Secondary | ICD-10-CM | POA: Diagnosis not present

## 2014-11-01 DIAGNOSIS — E784 Other hyperlipidemia: Secondary | ICD-10-CM | POA: Diagnosis not present

## 2014-11-01 DIAGNOSIS — E1140 Type 2 diabetes mellitus with diabetic neuropathy, unspecified: Secondary | ICD-10-CM | POA: Diagnosis not present

## 2014-11-01 DIAGNOSIS — I10 Essential (primary) hypertension: Secondary | ICD-10-CM | POA: Diagnosis not present

## 2014-11-01 DIAGNOSIS — Z6841 Body Mass Index (BMI) 40.0 and over, adult: Secondary | ICD-10-CM | POA: Diagnosis not present

## 2014-12-20 DIAGNOSIS — E119 Type 2 diabetes mellitus without complications: Secondary | ICD-10-CM | POA: Diagnosis not present

## 2014-12-20 DIAGNOSIS — G4700 Insomnia, unspecified: Secondary | ICD-10-CM | POA: Diagnosis not present

## 2014-12-20 DIAGNOSIS — Z1239 Encounter for other screening for malignant neoplasm of breast: Secondary | ICD-10-CM | POA: Diagnosis not present

## 2014-12-20 DIAGNOSIS — I10 Essential (primary) hypertension: Secondary | ICD-10-CM | POA: Diagnosis not present

## 2014-12-20 DIAGNOSIS — J302 Other seasonal allergic rhinitis: Secondary | ICD-10-CM | POA: Diagnosis not present

## 2014-12-20 DIAGNOSIS — E784 Other hyperlipidemia: Secondary | ICD-10-CM | POA: Diagnosis not present

## 2014-12-20 DIAGNOSIS — M5126 Other intervertebral disc displacement, lumbar region: Secondary | ICD-10-CM | POA: Diagnosis not present

## 2015-01-24 DIAGNOSIS — I10 Essential (primary) hypertension: Secondary | ICD-10-CM | POA: Diagnosis not present

## 2015-01-24 DIAGNOSIS — M5126 Other intervertebral disc displacement, lumbar region: Secondary | ICD-10-CM | POA: Diagnosis not present

## 2015-01-24 DIAGNOSIS — E1140 Type 2 diabetes mellitus with diabetic neuropathy, unspecified: Secondary | ICD-10-CM | POA: Diagnosis not present

## 2015-01-24 DIAGNOSIS — E039 Hypothyroidism, unspecified: Secondary | ICD-10-CM | POA: Diagnosis not present

## 2015-01-24 DIAGNOSIS — E784 Other hyperlipidemia: Secondary | ICD-10-CM | POA: Diagnosis not present

## 2015-03-07 DIAGNOSIS — Z719 Counseling, unspecified: Secondary | ICD-10-CM | POA: Diagnosis not present

## 2015-03-07 DIAGNOSIS — Z1389 Encounter for screening for other disorder: Secondary | ICD-10-CM | POA: Diagnosis not present

## 2015-03-07 DIAGNOSIS — I10 Essential (primary) hypertension: Secondary | ICD-10-CM | POA: Diagnosis not present

## 2015-03-07 DIAGNOSIS — E1040 Type 1 diabetes mellitus with diabetic neuropathy, unspecified: Secondary | ICD-10-CM | POA: Diagnosis not present

## 2015-03-07 DIAGNOSIS — Z6841 Body Mass Index (BMI) 40.0 and over, adult: Secondary | ICD-10-CM | POA: Diagnosis not present

## 2015-03-07 DIAGNOSIS — E039 Hypothyroidism, unspecified: Secondary | ICD-10-CM | POA: Diagnosis not present

## 2015-03-07 DIAGNOSIS — E1140 Type 2 diabetes mellitus with diabetic neuropathy, unspecified: Secondary | ICD-10-CM | POA: Diagnosis not present

## 2015-03-31 DIAGNOSIS — M79609 Pain in unspecified limb: Secondary | ICD-10-CM | POA: Diagnosis not present

## 2015-03-31 DIAGNOSIS — Z719 Counseling, unspecified: Secondary | ICD-10-CM | POA: Diagnosis not present

## 2015-03-31 DIAGNOSIS — E1140 Type 2 diabetes mellitus with diabetic neuropathy, unspecified: Secondary | ICD-10-CM | POA: Diagnosis not present

## 2015-03-31 DIAGNOSIS — M5126 Other intervertebral disc displacement, lumbar region: Secondary | ICD-10-CM | POA: Diagnosis not present

## 2015-03-31 DIAGNOSIS — I10 Essential (primary) hypertension: Secondary | ICD-10-CM | POA: Diagnosis not present

## 2015-04-02 ENCOUNTER — Ambulatory Visit (HOSPITAL_COMMUNITY)
Admission: RE | Admit: 2015-04-02 | Discharge: 2015-04-02 | Disposition: A | Discharge status: Home / Self Care | Payer: Medicare PPO | Source: Ambulatory Visit | Attending: Vascular Surgery | Admitting: Vascular Surgery

## 2015-04-02 ENCOUNTER — Other Ambulatory Visit (HOSPITAL_COMMUNITY): Payer: Self-pay | Admitting: Internal Medicine

## 2015-04-02 DIAGNOSIS — R52 Pain, unspecified: Secondary | ICD-10-CM | POA: Diagnosis not present

## 2015-04-02 DIAGNOSIS — I10 Essential (primary) hypertension: Secondary | ICD-10-CM | POA: Insufficient documentation

## 2015-04-02 DIAGNOSIS — E7800 Pure hypercholesterolemia, unspecified: Secondary | ICD-10-CM | POA: Diagnosis not present

## 2015-04-02 DIAGNOSIS — E119 Type 2 diabetes mellitus without complications: Secondary | ICD-10-CM | POA: Diagnosis not present

## 2015-04-02 DIAGNOSIS — M79604 Pain in right leg: Secondary | ICD-10-CM | POA: Diagnosis not present

## 2015-04-02 DIAGNOSIS — R600 Localized edema: Secondary | ICD-10-CM | POA: Diagnosis not present

## 2015-04-16 DIAGNOSIS — L97409 Non-pressure chronic ulcer of unspecified heel and midfoot with unspecified severity: Secondary | ICD-10-CM | POA: Diagnosis not present

## 2015-04-16 DIAGNOSIS — E1140 Type 2 diabetes mellitus with diabetic neuropathy, unspecified: Secondary | ICD-10-CM | POA: Diagnosis not present

## 2015-04-16 DIAGNOSIS — M542 Cervicalgia: Secondary | ICD-10-CM | POA: Diagnosis not present

## 2015-04-16 DIAGNOSIS — I10 Essential (primary) hypertension: Secondary | ICD-10-CM | POA: Diagnosis not present

## 2015-05-02 DIAGNOSIS — M722 Plantar fascial fibromatosis: Secondary | ICD-10-CM | POA: Diagnosis not present

## 2015-05-02 DIAGNOSIS — L84 Corns and callosities: Secondary | ICD-10-CM | POA: Diagnosis not present

## 2015-05-02 DIAGNOSIS — M216X2 Other acquired deformities of left foot: Secondary | ICD-10-CM | POA: Diagnosis not present

## 2015-05-02 DIAGNOSIS — E119 Type 2 diabetes mellitus without complications: Secondary | ICD-10-CM | POA: Diagnosis not present

## 2015-05-02 DIAGNOSIS — M216X1 Other acquired deformities of right foot: Secondary | ICD-10-CM | POA: Diagnosis not present

## 2015-07-27 ENCOUNTER — Encounter (HOSPITAL_COMMUNITY): Payer: Self-pay | Admitting: *Deleted

## 2015-07-27 ENCOUNTER — Emergency Department (HOSPITAL_COMMUNITY)
Admission: EM | Admit: 2015-07-27 | Discharge: 2015-07-27 | Disposition: A | Discharge status: Home / Self Care | Payer: Medicare PPO | Attending: Emergency Medicine | Admitting: Emergency Medicine

## 2015-07-27 DIAGNOSIS — I10 Essential (primary) hypertension: Secondary | ICD-10-CM | POA: Diagnosis not present

## 2015-07-27 DIAGNOSIS — E785 Hyperlipidemia, unspecified: Secondary | ICD-10-CM | POA: Insufficient documentation

## 2015-07-27 DIAGNOSIS — E1140 Type 2 diabetes mellitus with diabetic neuropathy, unspecified: Secondary | ICD-10-CM | POA: Diagnosis not present

## 2015-07-27 DIAGNOSIS — E039 Hypothyroidism, unspecified: Secondary | ICD-10-CM | POA: Insufficient documentation

## 2015-07-27 DIAGNOSIS — M542 Cervicalgia: Secondary | ICD-10-CM

## 2015-07-27 DIAGNOSIS — M545 Low back pain: Secondary | ICD-10-CM | POA: Diagnosis present

## 2015-07-27 DIAGNOSIS — Z794 Long term (current) use of insulin: Secondary | ICD-10-CM | POA: Insufficient documentation

## 2015-07-27 DIAGNOSIS — E7800 Pure hypercholesterolemia, unspecified: Secondary | ICD-10-CM | POA: Diagnosis not present

## 2015-07-27 DIAGNOSIS — Z7984 Long term (current) use of oral hypoglycemic drugs: Secondary | ICD-10-CM | POA: Diagnosis not present

## 2015-07-27 DIAGNOSIS — M5442 Lumbago with sciatica, left side: Secondary | ICD-10-CM | POA: Diagnosis not present

## 2015-07-27 DIAGNOSIS — R2689 Other abnormalities of gait and mobility: Secondary | ICD-10-CM | POA: Insufficient documentation

## 2015-07-27 DIAGNOSIS — Z792 Long term (current) use of antibiotics: Secondary | ICD-10-CM | POA: Diagnosis not present

## 2015-07-27 DIAGNOSIS — G8929 Other chronic pain: Secondary | ICD-10-CM | POA: Diagnosis not present

## 2015-07-27 MED ORDER — HYDROCODONE-ACETAMINOPHEN 5-325 MG PO TABS: 2.0000 | ORAL_TABLET | ORAL | Status: DC | PRN

## 2015-07-27 MED ORDER — OXYCODONE-ACETAMINOPHEN 5-325 MG PO TABS
1.0000 | ORAL_TABLET | Freq: Once | ORAL | Status: AC
  Administered 2015-07-27: 1 via ORAL
  Filled 2015-07-27: qty 1

## 2015-07-27 MED ORDER — DIAZEPAM 2 MG PO TABS
2.0000 mg | ORAL_TABLET | Freq: Once | ORAL | Status: AC
  Administered 2015-07-27: 2 mg via ORAL
  Filled 2015-07-27: qty 1

## 2015-07-27 MED ORDER — CYCLOBENZAPRINE HCL 10 MG PO TABS: 10.0000 mg | ORAL_TABLET | Freq: Two times a day (BID) | ORAL | Status: DC | PRN

## 2015-07-27 NOTE — ED Provider Notes (Signed)
CSN: [id number]     Arrival date & time 07/27/15  1323 History   First MD Initiated Contact with Patient 07/27/15 1757     Chief Complaint  Patient presents with  . Back Pain    HPI Comments: 55 year old female presents with acute on chronic neck pain and low back pain for the past week. Past medical history significant for herniated disc at L5 which she had surgery on in 2003 in [place], diabetes with neuropathy, hypertension, hyperlipidemia, hypothyroidism. She is unable to recall an injury but states when she gets out of bed in the morning and goes from a sitting to a standing position is when her pain is the most severe. She has pain that shoots down the left side of her body into her toes. Denies fever, IV drug use, urinary retention, bowel or bladder incontinence, saddle anesthesia. She has chronic numbness and tingling in her fingers and toes from diabetic neuropathy. She normally ambulates with a cane and has had more difficulty walking recently. She has been taking Norco for pain, last dose was yesterday. She states she is unable to take NSAIDs b/c she has issues with gastric ulcers.  Patient is a 55 y.o. female presenting with back pain.  Back Pain Associated symptoms: numbness   Associated symptoms: no abdominal pain, no chest pain, no fever and no weakness     Past Medical History  Diagnosis Date  . Diabetes mellitus without complication ([place])   . Hypertension   . Hypercholesteremia   . Neuropathy [facility])    Past Surgical History  Procedure Laterality Date  . Back surgery     History reviewed. No pertinent family history. Social History  Substance Use Topics  . Smoking status: Never Smoker   . Smokeless tobacco: None  . Alcohol Use: No   OB History    No data available     Review of Systems  Constitutional: Negative for fever.  Respiratory: Negative for shortness of breath.   Cardiovascular: Negative for chest pain.  Gastrointestinal: Negative for abdominal  pain.  Musculoskeletal: Positive for back pain, gait problem and neck pain.  Neurological: Positive for numbness. Negative for syncope and weakness.      Allergies  Lantus and Tradjenta  Home Medications   Prior to Admission medications   Medication Sig Start Date End Date Taking? Authorizing Provider  acetaminophen-codeine 120-12 MG/5ML solution Take 10 mLs by mouth every 4 (four) hours as needed for moderate pain. 07/31/14   Dalia Heading, PA-C  Dapagliflozin Propanediol (FARXIGA) 5 MG TABS Take 5 mg by mouth daily.    Historical Provider, MD  furosemide (LASIX) 20 MG tablet Take 20 mg by mouth daily.    Historical Provider, MD  gabapentin (NEURONTIN) 300 MG capsule Take 300 mg by mouth 3 (three) times daily.    Historical Provider, MD  glyBURIDE-metformin (GLUCOVANCE) 5-500 MG per tablet Take 2 tablets by mouth 2 (two) times daily with a meal.     Historical Provider, MD  Guaifenesin 1200 MG TB12 Take 1 tablet (1,200 mg total) by mouth 2 (two) times daily. 07/31/14   Dalia Heading, PA-C  hydrochlorothiazide (HYDRODIURIL) 25 MG tablet Take 25 mg by mouth daily. 07/20/14   Historical Provider, MD  HYDROcodone-acetaminophen (NORCO/VICODIN) 5-325 MG per tablet Take 1 tablet by mouth 2 (two) times daily as needed for moderate pain.     Historical Provider, MD  insulin lispro (HUMALOG) 100 UNIT/ML injection Inject 20-30 Units into the skin 2 (two) times daily.  Historical Provider, MD  Insulin Lispro Prot & Lispro (HUMALOG MIX 75/25 KWIKPEN) (75-25) 100 UNIT/ML Kwikpen Inject 20-30 Units into the skin 2 (two) times daily. 30 units and 20 units at night    Historical Provider, MD  KLOR-CON M10 10 MEQ tablet Take 10 mEq by mouth daily. 06/26/14   Historical Provider, MD  levofloxacin (LEVAQUIN) 750 MG tablet Take 1 tablet (750 mg total) by mouth daily. 07/31/14   Dalia Heading, PA-C  levothyroxine (SYNTHROID, LEVOTHROID) 200 MCG tablet Take 200 mcg by mouth daily before breakfast.     Historical Provider, MD  lisinopril (PRINIVIL,ZESTRIL) 40 MG tablet Take 40 mg by mouth daily.    Historical Provider, MD  omeprazole (PRILOSEC) 40 MG capsule Take 40 mg by mouth daily.    Historical Provider, MD  simvastatin (ZOCOR) 20 MG tablet Take 20 mg by mouth daily.    Historical Provider, MD  zolpidem (AMBIEN) 10 MG tablet Take 10 mg by mouth at bedtime as needed for sleep.    Historical Provider, MD   BP 121/86 mmHg  Pulse 83  Temp(Src) 98.4 F (36.9 C) (Oral)  Resp 18  SpO2 98%   Physical Exam  Constitutional: She is oriented to person, place, and time. She appears well-developed and well-nourished. No distress.  HENT:  Head: Normocephalic and atraumatic.  Eyes: Conjunctivae are normal. Pupils are equal, round, and reactive to light. Right eye exhibits no discharge. Left eye exhibits no discharge. No scleral icterus.  Neck: Normal range of motion.  Pulmonary/Chest: Effort normal.  Musculoskeletal:  Inspection: No masses, deformity Palpation: Tenderness to palpation of C-spine and left C-spine paraspinal muscles. Tenderness to palpation of L-spine and Left L-spine paraspinal muscles. ROM: Normal flexion, extension, lateral rotation and flexion of back.  Gait: Normal gait Reflexes: Patellar reflex is 2+ bilaterally, Achilles is 2+ bilaterally SLR: Negative seated straight leg raise   Neurological: She is alert and oriented to person, place, and time.  Skin: Skin is warm and dry.  Psychiatric: She has a normal mood and affect.    ED Course  Procedures (including critical care time) Labs Review Labs Reviewed - No data to display  Imaging Review No results found. I have personally reviewed and evaluated these images and lab results as part of my medical decision-making.   EKG Interpretation None      MDM   Final diagnoses:  Left-sided low back pain with left-sided sciatica  Neck pain   55 year old female presents with acute on chronic neck and back pain.  Symptoms could be musculoskeletal versus HNP. Valium and Percocet given for pain and muscle spasms with good relief. Prescriptions given for a muscle relaxer and pain medicine. Strongly advised to follow-up for further evaluation of her pain with imaging such as MRI. No red flag back pain signs or symptoms  Shared visit with Dr. Ralene Bathe. Patient is NAD, non-toxic, with stable VS. Patient is informed of clinical course, understands medical decision making process, and agrees with plan. Opportunity for questions provided and all questions answered. Return precautions given      Recardo Evangelist, PA-C 07/28/15 [place], MD 07/31/15 [phone]

## 2015-07-27 NOTE — ED Notes (Signed)
Pt reports neck pain that radiates down arm and has back pain, radiates down left leg. Difficulty ambulating. Hx of back surgery.

## 2015-08-01 ENCOUNTER — Other Ambulatory Visit (HOSPITAL_COMMUNITY): Payer: Self-pay | Admitting: Orthopedic Surgery

## 2015-08-01 DIAGNOSIS — M545 Low back pain: Secondary | ICD-10-CM

## 2015-08-08 ENCOUNTER — Encounter (HOSPITAL_COMMUNITY): Payer: Self-pay | Admitting: Radiology

## 2015-08-08 ENCOUNTER — Ambulatory Visit (HOSPITAL_COMMUNITY)
Admission: RE | Admit: 2015-08-08 | Discharge: 2015-08-08 | Disposition: A | Discharge status: Home / Self Care | Payer: Medicare PPO | Source: Ambulatory Visit | Attending: Orthopedic Surgery | Admitting: Orthopedic Surgery

## 2015-08-08 DIAGNOSIS — M5136 Other intervertebral disc degeneration, lumbar region: Secondary | ICD-10-CM | POA: Insufficient documentation

## 2015-08-08 DIAGNOSIS — M545 Low back pain: Secondary | ICD-10-CM

## 2015-08-08 DIAGNOSIS — M4806 Spinal stenosis, lumbar region: Secondary | ICD-10-CM | POA: Diagnosis not present

## 2015-08-08 LAB — CREATININE, SERUM
Creatinine, Ser: 1.48 mg/dL — ABNORMAL HIGH (ref 0.44–1.00)
GFR calc Af Amer: 45 mL/min — ABNORMAL LOW (ref >60)
GFR, EST NON AFRICAN AMERICAN: 39 mL/min — AB (ref >60)

## 2015-08-08 MED ORDER — GADOBENATE DIMEGLUMINE 529 MG/ML IV SOLN
20.0000 mL | Freq: Once | INTRAVENOUS | Status: AC | PRN
  Administered 2015-08-08: 20 mL via INTRAVENOUS

## 2015-10-09 ENCOUNTER — Other Ambulatory Visit: Payer: Self-pay

## 2015-10-09 DIAGNOSIS — Z1231 Encounter for screening mammogram for malignant neoplasm of breast: Secondary | ICD-10-CM

## 2015-10-10 ENCOUNTER — Emergency Department (HOSPITAL_COMMUNITY): Payer: Medicare PPO

## 2015-10-10 ENCOUNTER — Encounter (HOSPITAL_COMMUNITY): Payer: Self-pay

## 2015-10-10 ENCOUNTER — Inpatient Hospital Stay (HOSPITAL_COMMUNITY)
Admission: EM | Admit: 2015-10-10 | Discharge: 2015-10-12 | DRG: 872 | Disposition: A | Discharge status: Home / Self Care | Payer: Medicare PPO | Attending: Internal Medicine | Admitting: Internal Medicine

## 2015-10-10 DIAGNOSIS — I10 Essential (primary) hypertension: Secondary | ICD-10-CM | POA: Diagnosis present

## 2015-10-10 DIAGNOSIS — N611 Abscess of the breast and nipple: Secondary | ICD-10-CM | POA: Diagnosis not present

## 2015-10-10 DIAGNOSIS — E119 Type 2 diabetes mellitus without complications: Secondary | ICD-10-CM

## 2015-10-10 DIAGNOSIS — L0291 Cutaneous abscess, unspecified: Secondary | ICD-10-CM | POA: Diagnosis present

## 2015-10-10 DIAGNOSIS — E785 Hyperlipidemia, unspecified: Secondary | ICD-10-CM | POA: Diagnosis present

## 2015-10-10 DIAGNOSIS — Z888 Allergy status to other drugs, medicaments and biological substances status: Secondary | ICD-10-CM

## 2015-10-10 DIAGNOSIS — E039 Hypothyroidism, unspecified: Secondary | ICD-10-CM | POA: Diagnosis present

## 2015-10-10 DIAGNOSIS — I959 Hypotension, unspecified: Secondary | ICD-10-CM | POA: Diagnosis present

## 2015-10-10 DIAGNOSIS — A419 Sepsis, unspecified organism: Secondary | ICD-10-CM | POA: Diagnosis not present

## 2015-10-10 DIAGNOSIS — Z79899 Other long term (current) drug therapy: Secondary | ICD-10-CM

## 2015-10-10 DIAGNOSIS — Z794 Long term (current) use of insulin: Secondary | ICD-10-CM

## 2015-10-10 DIAGNOSIS — Z8249 Family history of ischemic heart disease and other diseases of the circulatory system: Secondary | ICD-10-CM

## 2015-10-10 DIAGNOSIS — Z87891 Personal history of nicotine dependence: Secondary | ICD-10-CM

## 2015-10-10 DIAGNOSIS — E7800 Pure hypercholesterolemia, unspecified: Secondary | ICD-10-CM | POA: Diagnosis present

## 2015-10-10 DIAGNOSIS — L0390 Cellulitis, unspecified: Secondary | ICD-10-CM

## 2015-10-10 DIAGNOSIS — E1140 Type 2 diabetes mellitus with diabetic neuropathy, unspecified: Secondary | ICD-10-CM | POA: Diagnosis present

## 2015-10-10 DIAGNOSIS — IMO0002 Reserved for concepts with insufficient information to code with codable children: Secondary | ICD-10-CM

## 2015-10-10 HISTORY — DX: Hypothyroidism, unspecified: E03.9

## 2015-10-10 LAB — GLUCOSE, CAPILLARY: GLUCOSE-CAPILLARY: 222 mg/dL — AB (ref 65–99)

## 2015-10-10 LAB — CBC WITH DIFFERENTIAL/PLATELET
BASOS ABS: 0 10*3/uL (ref 0.0–0.1)
BASOS PCT: 0 %
Eosinophils Absolute: 0 10*3/uL (ref 0.0–0.7)
Eosinophils Relative: 0 %
HEMATOCRIT: 49.7 % — AB (ref 36.0–46.0)
Hemoglobin: 16.5 g/dL — ABNORMAL HIGH (ref 12.0–15.0)
Lymphocytes Relative: 21 %
Lymphs Abs: 1.7 10*3/uL (ref 0.7–4.0)
MCH: 27.5 pg (ref 26.0–34.0)
MCHC: 33.2 g/dL (ref 30.0–36.0)
MCV: 83 fL (ref 78.0–100.0)
MONO ABS: 0.7 10*3/uL (ref 0.1–1.0)
Monocytes Relative: 8 %
NEUTROS ABS: 5.9 10*3/uL (ref 1.7–7.7)
NEUTROS PCT: 71 %
Platelets: 218 10*3/uL (ref 150–400)
RBC: 5.99 MIL/uL — AB (ref 3.87–5.11)
RDW: 13.5 % (ref 11.5–15.5)
WBC: 8.3 10*3/uL (ref 4.0–10.5)

## 2015-10-10 LAB — COMPREHENSIVE METABOLIC PANEL
ALK PHOS: 80 U/L (ref 38–126)
ALT: 15 U/L (ref 14–54)
ANION GAP: 7 (ref 5–15)
AST: 15 U/L (ref 15–41)
Albumin: 2.9 g/dL — ABNORMAL LOW (ref 3.5–5.0)
BILIRUBIN TOTAL: 0.6 mg/dL (ref 0.3–1.2)
BUN: 28 mg/dL — ABNORMAL HIGH (ref 6–20)
CALCIUM: 8.8 mg/dL — AB (ref 8.9–10.3)
CO2: 27 mmol/L (ref 22–32)
Chloride: 104 mmol/L (ref 101–111)
Creatinine, Ser: 1.02 mg/dL — ABNORMAL HIGH (ref 0.44–1.00)
GLUCOSE: 76 mg/dL (ref 65–99)
POTASSIUM: 3.2 mmol/L — AB (ref 3.5–5.1)
Sodium: 138 mmol/L (ref 135–145)
TOTAL PROTEIN: 6.2 g/dL — AB (ref 6.5–8.1)

## 2015-10-10 LAB — LACTIC ACID, PLASMA
Lactic Acid, Venous: 1.4 mmol/L (ref 0.5–1.9)
Lactic Acid, Venous: 1.2 mmol/L (ref 0.5–1.9)

## 2015-10-10 LAB — APTT: APTT: 29 s (ref 24–37)

## 2015-10-10 LAB — TSH: TSH: 0.277 u[IU]/mL — AB (ref 0.350–4.500)

## 2015-10-10 LAB — PROTIME-INR
INR: 0.99 (ref 0.00–1.49)
Prothrombin Time: 13.3 seconds (ref 11.6–15.2)

## 2015-10-10 LAB — I-STAT CG4 LACTIC ACID, ED
Lactic Acid, Venous: 1.19 mmol/L (ref 0.5–1.9)
Lactic Acid, Venous: 0.79 mmol/L (ref 0.5–1.9)

## 2015-10-10 LAB — T4, FREE: Free T4: 1.4 ng/dL — ABNORMAL HIGH (ref 0.61–1.12)

## 2015-10-10 LAB — BRAIN NATRIURETIC PEPTIDE: B Natriuretic Peptide: 36.9 pg/mL (ref 0.0–100.0)

## 2015-10-10 LAB — TROPONIN I

## 2015-10-10 LAB — D-DIMER, QUANTITATIVE (NOT AT ARMC): D DIMER QUANT: 2.37 ug{FEU}/mL — AB (ref 0.00–0.50)

## 2015-10-10 LAB — PROCALCITONIN: Procalcitonin: <0.1 ng/mL

## 2015-10-10 MED ORDER — GABAPENTIN 300 MG PO CAPS
600.0000 mg | ORAL_CAPSULE | Freq: Two times a day (BID) | ORAL | Status: DC
  Administered 2015-10-10 – 2015-10-12 (×4): 600 mg via ORAL
  Filled 2015-10-10 (×4): qty 2

## 2015-10-10 MED ORDER — SENNA 8.6 MG PO TABS
1.0000 | ORAL_TABLET | Freq: Two times a day (BID) | ORAL | Status: DC
  Administered 2015-10-10 – 2015-10-12 (×4): 8.6 mg via ORAL
  Filled 2015-10-10 (×4): qty 1

## 2015-10-10 MED ORDER — VANCOMYCIN HCL IN DEXTROSE 1-5 GM/200ML-% IV SOLN: 1000.0000 mg | Freq: Once | INTRAVENOUS | Status: DC

## 2015-10-10 MED ORDER — IOPAMIDOL (ISOVUE-300) INJECTION 61%
INTRAVENOUS | Status: AC
  Administered 2015-10-10: 75 mL
  Filled 2015-10-10: qty 75

## 2015-10-10 MED ORDER — SODIUM CHLORIDE 0.9% FLUSH
3.0000 mL | Freq: Two times a day (BID) | INTRAVENOUS | Status: DC
  Administered 2015-10-10 – 2015-10-12 (×4): 3 mL via INTRAVENOUS

## 2015-10-10 MED ORDER — SIMVASTATIN 20 MG PO TABS
20.0000 mg | ORAL_TABLET | Freq: Every day | ORAL | Status: DC
  Administered 2015-10-10 – 2015-10-12 (×3): 20 mg via ORAL
  Filled 2015-10-10 (×3): qty 1

## 2015-10-10 MED ORDER — PIPERACILLIN-TAZOBACTAM 3.375 G IVPB
3.3750 g | Freq: Three times a day (TID) | INTRAVENOUS | Status: DC
  Administered 2015-10-10 – 2015-10-12 (×6): 3.375 g via INTRAVENOUS
  Filled 2015-10-10 (×9): qty 50

## 2015-10-10 MED ORDER — LEVOTHYROXINE SODIUM 100 MCG PO TABS
200.0000 ug | ORAL_TABLET | Freq: Every day | ORAL | Status: DC
  Administered 2015-10-11 – 2015-10-12 (×2): 200 ug via ORAL
  Filled 2015-10-10 (×2): qty 2

## 2015-10-10 MED ORDER — DOCUSATE SODIUM 100 MG PO CAPS
100.0000 mg | ORAL_CAPSULE | Freq: Two times a day (BID) | ORAL | Status: DC
  Administered 2015-10-10 – 2015-10-12 (×4): 100 mg via ORAL
  Filled 2015-10-10 (×3): qty 1

## 2015-10-10 MED ORDER — PANTOPRAZOLE SODIUM 40 MG PO TBEC
40.0000 mg | DELAYED_RELEASE_TABLET | Freq: Every day | ORAL | Status: DC
  Administered 2015-10-10 – 2015-10-12 (×3): 40 mg via ORAL
  Filled 2015-10-10 (×3): qty 1

## 2015-10-10 MED ORDER — ENOXAPARIN SODIUM 40 MG/0.4ML ~~LOC~~ SOLN
40.0000 mg | SUBCUTANEOUS | Status: DC
  Administered 2015-10-10 – 2015-10-11 (×2): 40 mg via SUBCUTANEOUS
  Filled 2015-10-10 (×2): qty 0.4

## 2015-10-10 MED ORDER — INSULIN ASPART PROT & ASPART (70-30 MIX) 100 UNIT/ML PEN
40.0000 [IU] | PEN_INJECTOR | Freq: Two times a day (BID) | SUBCUTANEOUS | Status: DC
  Administered 2015-10-10 – 2015-10-11 (×2): 40 [IU] via SUBCUTANEOUS
  Administered 2015-10-11: 50 [IU] via SUBCUTANEOUS
  Administered 2015-10-12: 40 [IU] via SUBCUTANEOUS
  Filled 2015-10-10: qty 3

## 2015-10-10 MED ORDER — VANCOMYCIN HCL 10 G IV SOLR
2000.0000 mg | Freq: Once | INTRAVENOUS | Status: AC
  Administered 2015-10-10: 2000 mg via INTRAVENOUS
  Filled 2015-10-10: qty 2000

## 2015-10-10 MED ORDER — MORPHINE SULFATE (PF) 2 MG/ML IV SOLN
2.0000 mg | INTRAVENOUS | Status: DC | PRN
  Administered 2015-10-10 – 2015-10-11 (×2): 2 mg via INTRAVENOUS
  Filled 2015-10-10 (×2): qty 1

## 2015-10-10 MED ORDER — VANCOMYCIN HCL 10 G IV SOLR
1250.0000 mg | Freq: Two times a day (BID) | INTRAVENOUS | Status: DC
  Administered 2015-10-10 – 2015-10-12 (×4): 1250 mg via INTRAVENOUS
  Filled 2015-10-10 (×5): qty 1250

## 2015-10-10 MED ORDER — ZOLPIDEM TARTRATE 5 MG PO TABS
5.0000 mg | ORAL_TABLET | Freq: Every evening | ORAL | Status: DC | PRN
  Administered 2015-10-11: 5 mg via ORAL
  Filled 2015-10-10: qty 1

## 2015-10-10 MED ORDER — PIPERACILLIN-TAZOBACTAM 3.375 G IVPB 30 MIN
3.3750 g | Freq: Once | INTRAVENOUS | Status: DC
  Filled 2015-10-10: qty 50

## 2015-10-10 MED ORDER — POTASSIUM CHLORIDE CRYS ER 10 MEQ PO TBCR
10.0000 meq | EXTENDED_RELEASE_TABLET | Freq: Every day | ORAL | Status: DC
  Administered 2015-10-10 – 2015-10-12 (×3): 10 meq via ORAL
  Filled 2015-10-10 (×3): qty 1

## 2015-10-10 MED ORDER — ACETAMINOPHEN 325 MG PO TABS
650.0000 mg | ORAL_TABLET | Freq: Four times a day (QID) | ORAL | Status: DC | PRN
  Filled 2015-10-10: qty 2

## 2015-10-10 MED ORDER — ACETAMINOPHEN 650 MG RE SUPP: 650.0000 mg | Freq: Four times a day (QID) | RECTAL | Status: DC | PRN

## 2015-10-10 MED ORDER — INSULIN ASPART 100 UNIT/ML ~~LOC~~ SOLN: 0.0000 [IU] | Freq: Three times a day (TID) | SUBCUTANEOUS | Status: DC

## 2015-10-10 MED ORDER — LACTATED RINGERS IV SOLN
INTRAVENOUS | Status: DC
  Administered 2015-10-10 – 2015-10-12 (×3): via INTRAVENOUS

## 2015-10-10 NOTE — ED Notes (Signed)
Pt presents with report of hypotension while at urgent care today.  Pt received 1.5 liters of NS due to BP (80/60) with repeat 108/70 upon EMS arrival.

## 2015-10-10 NOTE — ED Notes (Signed)
Warm blankets applied

## 2015-10-10 NOTE — ED Provider Notes (Signed)
CSN: [id number]     Arrival date & time 10/10/15  1159 History   First MD Initiated Contact with Patient 10/10/15 1221     No chief complaint on file.    (Consider location/radiation/quality/duration/timing/severity/associated sxs/prior Treatment) HPI Sarah Gallegos is a 55 year old female with past history of diabetes, hypertension, and hyperlipidemia presenting today for generalized malaise with chills, diaphoresis, and hypotension. She was seen at her doctor's office for a scheduled appointment and was found to have a systolic blood pressure in the 80s and was sent to the emergency department for further evaluation. She denies cough but states she's had a "heaviness" feeling in her chest as if she needs to cough for the last 2 days. She also has had vaginal discharge consistent with previous candidal infections but did not improve with a dose of Diflucan 2 weeks ago. She denies any abdominal pain, nausea, or vomiting. She did have diarrhea 2 days ago but that has since improved. She reports global weakness and difficulty ambulating because of it. She has a mild headache, no photophobia or midline neck pain. She does not believe she's been around any sick contacts. She received a liter and a half of IV fluid prior to arrival in the emergency department.  She also reports an abscess inferior to the left breast that had significant drainage yesterday. She reports "it looked like a third breast." There has been scant drainage today some surrounding tenderness.    Past Medical History  Diagnosis Date  . Diabetes mellitus without complication ([place])   . Hypertension   . Hypercholesteremia   . Neuropathy [facility])    Past Surgical History  Procedure Laterality Date  . Back surgery      2003, [facility], [place], [place]  . Gallbladder surgery      2003, [facility], [place], [place]  . Breast biopsy      2003, [facility], [place],   . Abdominal hysterectomy      2008  . Eye  surgery      2010,  for graves disease. [place], [place]   History reviewed. No pertinent family history. Social History  Substance Use Topics  . Smoking status: Never Smoker   . Smokeless tobacco: None  . Alcohol Use: No   OB History    No data available     Review of Systems  Constitutional: Positive for chills, diaphoresis and fatigue. Negative for fever.  HENT: Negative for congestion and drooling.   Eyes: Negative for photophobia.  Respiratory: Negative for cough, choking, chest tightness and shortness of breath.   Cardiovascular: Positive for leg swelling. Negative for chest pain.  Gastrointestinal: Positive for diarrhea. Negative for nausea, vomiting, constipation, blood in stool, abdominal distention and rectal pain.  Genitourinary: Positive for vaginal discharge (itchy, consistent with previous candidiasis). Negative for dysuria and vaginal bleeding.  Musculoskeletal: Negative for back pain.  Skin: Negative for rash.  Neurological: Positive for light-headedness and headaches (mild left side). Negative for seizures, syncope, speech difficulty and numbness.  Psychiatric/Behavioral: Negative for confusion and agitation. The patient is not nervous/anxious.       Allergies  Lantus and Tradjenta  Home Medications   Prior to Admission medications   Medication Sig Start Date End Date Taking? Authorizing Provider  acetaminophen-codeine 120-12 MG/5ML solution Take 10 mLs by mouth every 4 (four) hours as needed for moderate pain. 07/31/14   Dalia Heading, PA-C  cyclobenzaprine (FLEXERIL) 10 MG tablet Take 1 tablet (10 mg total) by mouth 2 (two)  times daily as needed for muscle spasms. 07/27/15   Recardo Evangelist, PA-C  dapagliflozin propanediol (FARXIGA) 10 MG TABS tablet Take 10 mg by mouth daily.    Historical Provider, MD  furosemide (LASIX) 20 MG tablet Take 20 mg by mouth daily.    Historical Provider, MD  gabapentin (NEURONTIN) 300 MG capsule Take 300-600 mg by mouth 2 (two)  times daily. TAKES 1 CAP IN AM AND 2 CAPS AT BEDTIME    Historical Provider, MD  Guaifenesin 1200 MG TB12 Take 1 tablet (1,200 mg total) by mouth 2 (two) times daily. 07/31/14   Dalia Heading, PA-C  hydrochlorothiazide (HYDRODIURIL) 25 MG tablet Take 25 mg by mouth daily. 07/20/14   Historical Provider, MD  HYDROcodone-acetaminophen (NORCO/VICODIN) 5-325 MG per tablet Take 1 tablet by mouth 2 (two) times daily as needed for moderate pain.     Historical Provider, MD  HYDROcodone-acetaminophen (NORCO/VICODIN) 5-325 MG tablet Take 2 tablets by mouth every 4 (four) hours as needed. 07/27/15   Recardo Evangelist, PA-C  KLOR-CON M10 10 MEQ tablet Take 10 mEq by mouth daily. 06/26/14   Historical Provider, MD  levofloxacin (LEVAQUIN) 750 MG tablet Take 1 tablet (750 mg total) by mouth daily. 07/31/14   Dalia Heading, PA-C  levothyroxine (SYNTHROID, LEVOTHROID) 200 MCG tablet Take 200 mcg by mouth daily before breakfast.    Historical Provider, MD  lisinopril (PRINIVIL,ZESTRIL) 40 MG tablet Take 40 mg by mouth daily.    Historical Provider, MD  metFORMIN (GLUCOPHAGE) 1000 MG tablet Take 1,000 mg by mouth 2 (two) times daily with a meal.    Historical Provider, MD  NOVOLOG MIX 70/30 FLEXPEN (70-30) 100 UNIT/ML FlexPen Inject 40-50 Units into the skin 2 (two) times daily. TAKES 50 UNITS IN AM AND 40 UNITS IN PM 07/10/15   Historical Provider, MD  omeprazole (PRILOSEC) 40 MG capsule Take 40 mg by mouth daily.    Historical Provider, MD  simvastatin (ZOCOR) 20 MG tablet Take 20 mg by mouth daily at 6 PM.     Historical Provider, MD  zolpidem (AMBIEN) 10 MG tablet Take 10 mg by mouth at bedtime as needed for sleep.    Historical Provider, MD   BP 109/75 mmHg  Pulse 68  Temp(Src) 96.2 F (35.7 C) (Rectal)  Resp 22  Ht 5\' 8"  (1.727 m)  Wt 116.574 kg  BMI 39.09 kg/m2  SpO2 94% Physical Exam  Constitutional: She is oriented to person, place, and time. She appears well-developed and well-nourished. She  appears lethargic. No distress.  HENT:  Head: Normocephalic and atraumatic.  Eyes: Conjunctivae are normal.  Cardiovascular: Normal rate and normal heart sounds.   No murmur heard. Pulmonary/Chest: Effort normal and breath sounds normal. She has no wheezes. She has no rales.  Abdominal: Soft. There is no tenderness.  Musculoskeletal: She exhibits edema (1+ billateral lower extremity).  Point tenderness in the base of the left neck  Neurological: She is oriented to person, place, and time. She appears lethargic. No cranial nerve deficit or sensory deficit. GCS eye subscore is 4. GCS verbal subscore is 5. GCS motor subscore is 6.  Global weakness. No pain in the neck with flexion or extension.  Skin: Skin is warm. She is not diaphoretic.  4 cm wound inferior to left breast. Fluctuance of the center of the wound.   Psychiatric: She has a normal mood and affect. Her behavior is normal.  Nursing note and vitals reviewed.   ED Course  Procedures (including critical  care time) Labs Review Labs Reviewed  COMPREHENSIVE METABOLIC PANEL - Abnormal; Notable for the following:    Potassium 3.2 (*)    BUN 28 (*)    Creatinine, Ser 1.02 (*)    Calcium 8.8 (*)    Total Protein 6.2 (*)    Albumin 2.9 (*)    All other components within normal limits  CBC WITH DIFFERENTIAL/PLATELET - Abnormal; Notable for the following:    RBC 5.99 (*)    Hemoglobin 16.5 (*)    HCT 49.7 (*)    All other components within normal limits  D-DIMER, QUANTITATIVE (NOT AT [facility]) - Abnormal; Notable for the following:    D-Dimer, Quant 2.37 (*)    All other components within normal limits  TSH - Abnormal; Notable for the following:    TSH 0.277 (*)    All other components within normal limits  CULTURE, BLOOD (ROUTINE X 2)  CULTURE, BLOOD (ROUTINE X 2)  URINE CULTURE  BRAIN NATRIURETIC PEPTIDE  TROPONIN I  URINALYSIS, ROUTINE W REFLEX MICROSCOPIC (NOT AT [facility])  I-STAT CG4 LACTIC ACID, ED  CBG MONITORING, ED   I-STAT CG4 LACTIC ACID, ED    Imaging Review Ct Head Wo Contrast  10/10/2015  CLINICAL DATA:  Left site headache EXAM: CT HEAD WITHOUT CONTRAST TECHNIQUE: Contiguous axial images were obtained from the base of the skull through the vertex without intravenous contrast. COMPARISON:  01/07/2009 FINDINGS: No skull fracture is noted. Bilateral exophthalmos again noted without change from prior exam. Paranasal sinuses and mastoid air cells are unremarkable. No intracranial hemorrhage, mass effect or midline shift. No acute cortical infarction. No mass lesion is noted on this unenhanced scan. Mild atherosclerotic calcifications of carotid siphon. Ventricular size is stable from prior exam. IMPRESSION: No acute intracranial abnormality.  No significant change. Electronically Signed   By: Lahoma Crocker M.D.   On: 10/10/2015 14:34   Ct Chest W Contrast  10/10/2015  CLINICAL DATA:  Chest pain.  Soft tissue abscess. EXAM: CT CHEST WITH CONTRAST TECHNIQUE: Multidetector CT imaging of the chest was performed during intravenous contrast administration. CONTRAST:  38mL ISOVUE-300 IOPAMIDOL (ISOVUE-300) INJECTION 61% COMPARISON:  Chest radiograph October 10, 2015 FINDINGS: Mediastinum/Lymph Nodes: Thyroid appears normal. There is no appreciable thoracic adenopathy. There is a small hiatal hernia. There is air throughout much of the esophagus. There is no thoracic aortic aneurysm or dissection. No pulmonary embolus is identified in the major vessels. There are scattered foci of atherosclerotic calcification in the aorta. There are scattered foci of coronary artery calcification. The pericardium is not appreciably thickened. Lungs/Pleura: There is patchy atelectasis in the lower lobes bilaterally, slightly more on the right than on the left. There is no appreciable airspace consolidation or edema. Upper abdomen: Visualized upper abdominal structures appear unremarkable except for absence of the gallbladder. Musculoskeletal: There  is a soft tissue lesion along the left anterior mid chest wall slightly to the left of midline with soft tissue stranding in this area. This lesion measures 4.1 x 3.3 x 2.5 cm. It does not contain air. No similar appearing lesions are identified elsewhere. There is degenerative change in the mid thoracic spine region, most notably at T6-7 where there is extensive endplate irregularity on both sides of the disc. There are no blastic or lytic bone lesions. IMPRESSION: Soft tissue lesion along the anterior chest wall superficially just to the left of midline in the mid chest region measuring 4.1 x 3.3 x 2.5 cm. This lesion does not contain air but  does have irregular borders and has an inflammatory appearing etiology. No well-defined fluid level is seen in this lesion. Similar lesions are not noted elsewhere. Marked disc degeneration at T6-7. This focal severe degenerative type change in the absence of similar change elsewhere raises question of prior infection in this area with residual endplate irregularity and disc degeneration. No paraspinous lesion is appreciable in this region. Patchy atelectasis in both lower lung zones. No edema or consolidation. No adenopathy. Small hiatal hernia. Scattered foci of aortic atherosclerosis. Scattered foci of coronary artery calcification noted. Electronically Signed   By: Lowella Grip III M.D.   On: 10/10/2015 14:40   Dg Chest Port 1 View  10/10/2015  CLINICAL DATA:  Chest pain. EXAM: PORTABLE CHEST 1 VIEW COMPARISON:  Radiograph of Jul 31, 2014. FINDINGS: The heart size and mediastinal contours are within normal limits. Both lungs are clear. No pneumothorax or pleural effusion is noted. The visualized skeletal structures are unremarkable. IMPRESSION: No acute cardiopulmonary abnormality seen. Electronically Signed   By: Marijo Conception, M.D.   On: 10/10/2015 12:59   I have personally reviewed and evaluated these images and lab results as part of my medical  decision-making.   EKG Interpretation   Date/Time:  Friday October 10 2015 13:04:39 EDT Ventricular Rate:  74 PR Interval:    QRS Duration: 96 QT Interval:  419 QTC Calculation: 465 R Axis:   -8 Text Interpretation:  Sinus rhythm Borderline T abnormalities, inferior  leads Baseline wander in lead(s) I II aVR V3 V5 No significant change  since last tracing Confirmed by ALLEN  MD, ANTHONY ([id number]) on 10/10/2015  1:21:03 PM      MDM   Final diagnoses:  Wound abscess  Sepsis, due to unspecified organism [facility])   A 55 year old female with past medical history of diabetes, hypertension, and hyperlipidemia presenting today for generalized malaise with chills and diaphoresis over the last 2 days and hypotension found at clinic today. On arrival she had a blood pressure of 109/75 after 1.5 L of IV fluid given by PCP and in route.  On arrival she was lethargic and had a blood pressure of 109/75. Rectal temperature was 96.2. She had 22 respirations per minute. She was given another liter of normal saline and vancomycin was started for presumed sepsis secondary to cutaneous abscess inferior to left breast. CT of the head and chest showed no acute intracranial abnormality or pulmonary abnormality. There was a soft tissue lesion consistent with abscess measuring 4 x 3 x 2 cm in the left anterior chest wall. Laboratory evaluation demonstrated mildly elevated d-dimer, mild hyperthyroidism likely iatrogenic, potassium 3.2. Blood cultures, BNP, troponin, lactic acid, and point-of-care glucose were otherwise normal.  EKG showed a ventricular rate of 74 bpm with no evidence of ischemia, abnormal intervals, or dysrhythmia.  Sepsis appears to be due to the abscess in her anterior chest. No evidence of pneumonia. Patient denies urinary symptoms. Unlikely meningitis as patient has no severe headache, photophobia, or meningeal neck pain.  On reexamination after fluid bolus patient states she feels much improved  and appears to be less lethargic with improvement in strength. She was informed of plan to admit for further care of infection and she was agreeable.   Allie Bossier, MD 10/10/15 [phone]

## 2015-10-10 NOTE — ED Notes (Signed)
Admitting doctor at the bedside 

## 2015-10-10 NOTE — ED Notes (Signed)
Report called to rn on 2w

## 2015-10-10 NOTE — Consult Note (Signed)
Reason for Consult:Breast abscess Referring Physician: Nada Gallegos is an 55 y.o. female.  HPI: Pt reports a 1 week hx/o a left breast abscess. It had been fairly mild until yesterday when it became more painful and larger. Last night it spontaneously drained a large amount of pus. It feels a little bit better since then. She's continued to have some mild discharge since then. She was seen at her MD's office but sent to the ED 2/2 SBP in the 80's. She admits to 1 prior e/o breast abscess years ago that was much worse and larger. She denies any known hx/o MRSA infections.  Past Medical History  Diagnosis Date  . Diabetes mellitus without complication ([place])   . Hypertension   . Hypercholesteremia   . Neuropathy [facility])     Past Surgical History  Procedure Laterality Date  . Back surgery      2003, [facility], [place], [place]  . Gallbladder surgery      2003, [facility], [place], [place]  . Breast biopsy      2003, [facility], [place],   . Abdominal hysterectomy      2008  . Eye surgery      2010,  for graves disease. [place], [place]    Family History  Problem Relation Age of Onset  . Heart failure Mother   . Stroke Mother   . Hypertension Father     Social History:  reports that she has quit smoking. Her smoking use included Cigarettes. She has a 7 pack-year smoking history. She does not have any smokeless tobacco history on file. She reports that she does not drink alcohol or use illicit drugs.  Allergies:  Allergies  Allergen Reactions  . Lantus [Insulin Glargine] Rash and Other (See Comments)    Face breaks out really bad  . Tradjenta [Linagliptin] Rash and Other (See Comments)    Face breaks out   Medications: I have reviewed the patient's current medications.  Results for orders placed or performed during the hospital encounter of 10/10/15 (from the past 48 hour(s))  Comprehensive metabolic panel     Status: Abnormal   Collection  Time: 10/10/15 12:30 PM  Result Value Ref Range   Sodium 138 135 - 145 mmol/L   Potassium 3.2 (L) 3.5 - 5.1 mmol/L   Chloride 104 101 - 111 mmol/L   CO2 27 22 - 32 mmol/L   Glucose, Bld 76 65 - 99 mg/dL   BUN 28 (H) 6 - 20 mg/dL   Creatinine, Ser 1.02 (H) 0.44 - 1.00 mg/dL   Calcium 8.8 (L) 8.9 - 10.3 mg/dL   Total Protein 6.2 (L) 6.5 - 8.1 g/dL   Albumin 2.9 (L) 3.5 - 5.0 g/dL   AST 15 15 - 41 U/L   ALT 15 14 - 54 U/L   Alkaline Phosphatase 80 38 - 126 U/L   Total Bilirubin 0.6 0.3 - 1.2 mg/dL   GFR calc non Af Amer >60 >60 mL/min   GFR calc Af Amer >60 >60 mL/min    Comment: (NOTE) The eGFR has been calculated using the CKD EPI equation. This calculation has not been validated in all clinical situations. eGFR's persistently <60 mL/min signify possible Chronic Kidney Disease.    Anion gap 7 5 - 15  CBC WITH DIFFERENTIAL     Status: Abnormal   Collection Time: 10/10/15 12:30 PM  Result Value Ref Range   WBC 8.3 4.0 - 10.5 K/uL  RBC 5.99 (H) 3.87 - 5.11 MIL/uL   Hemoglobin 16.5 (H) 12.0 - 15.0 g/dL   HCT 49.7 (H) 36.0 - 46.0 %   MCV 83.0 78.0 - 100.0 fL   MCH 27.5 26.0 - 34.0 pg   MCHC 33.2 30.0 - 36.0 g/dL   RDW 13.5 11.5 - 15.5 %   Platelets 218 150 - 400 K/uL   Neutrophils Relative % 71 %   Neutro Abs 5.9 1.7 - 7.7 K/uL   Lymphocytes Relative 21 %   Lymphs Abs 1.7 0.7 - 4.0 K/uL   Monocytes Relative 8 %   Monocytes Absolute 0.7 0.1 - 1.0 K/uL   Eosinophils Relative 0 %   Eosinophils Absolute 0.0 0.0 - 0.7 K/uL   Basophils Relative 0 %   Basophils Absolute 0.0 0.0 - 0.1 K/uL  Brain natriuretic peptide     Status: None   Collection Time: 10/10/15 12:30 PM  Result Value Ref Range   B Natriuretic Peptide 36.9 0.0 - 100.0 pg/mL  D-dimer, quantitative (not at [facility])     Status: Abnormal   Collection Time: 10/10/15 12:30 PM  Result Value Ref Range   D-Dimer, Quant 2.37 (H) 0.00 - 0.50 ug/mL-FEU    Comment: (NOTE) At the manufacturer cut-off of 0.50 ug/mL FEU, this  assay has been documented to exclude PE with a sensitivity and negative predictive value of 97 to 99%.  At this time, this assay has not been approved by the FDA to exclude DVT/VTE. Results should be correlated with clinical presentation.   I-Stat CG4 Lactic Acid, ED  (not at  [facility])     Status: None   Collection Time: 10/10/15 12:39 PM  Result Value Ref Range   Lactic Acid, Venous 1.19 0.5 - 1.9 mmol/L  TSH     Status: Abnormal   Collection Time: 10/10/15  1:30 PM  Result Value Ref Range   TSH 0.277 (L) 0.350 - 4.500 uIU/mL  Troponin I     Status: None   Collection Time: 10/10/15  2:00 PM  Result Value Ref Range   Troponin I <0.03 <0.03 ng/mL  I-Stat CG4 Lactic Acid, ED  (not at  [facility])     Status: None   Collection Time: 10/10/15  3:35 PM  Result Value Ref Range   Lactic Acid, Venous 0.79 0.5 - 1.9 mmol/L    Ct Head Wo Contrast  10/10/2015  CLINICAL DATA:  Left site headache EXAM: CT HEAD WITHOUT CONTRAST TECHNIQUE: Contiguous axial images were obtained from the base of the skull through the vertex without intravenous contrast. COMPARISON:  01/07/2009 FINDINGS: No skull fracture is noted. Bilateral exophthalmos again noted without change from prior exam. Paranasal sinuses and mastoid air cells are unremarkable. No intracranial hemorrhage, mass effect or midline shift. No acute cortical infarction. No mass lesion is noted on this unenhanced scan. Mild atherosclerotic calcifications of carotid siphon. Ventricular size is stable from prior exam. IMPRESSION: No acute intracranial abnormality.  No significant change. Electronically Signed   By: Lahoma Crocker M.D.   On: 10/10/2015 14:34   Ct Chest W Contrast  10/10/2015  CLINICAL DATA:  Chest pain.  Soft tissue abscess. EXAM: CT CHEST WITH CONTRAST TECHNIQUE: Multidetector CT imaging of the chest was performed during intravenous contrast administration. CONTRAST:  49m ISOVUE-300 IOPAMIDOL (ISOVUE-300) INJECTION 61% COMPARISON:  Chest radiograph  October 10, 2015 FINDINGS: Mediastinum/Lymph Nodes: Thyroid appears normal. There is no appreciable thoracic adenopathy. There is a small hiatal hernia. There is air throughout much of  the esophagus. There is no thoracic aortic aneurysm or dissection. No pulmonary embolus is identified in the major vessels. There are scattered foci of atherosclerotic calcification in the aorta. There are scattered foci of coronary artery calcification. The pericardium is not appreciably thickened. Lungs/Pleura: There is patchy atelectasis in the lower lobes bilaterally, slightly more on the right than on the left. There is no appreciable airspace consolidation or edema. Upper abdomen: Visualized upper abdominal structures appear unremarkable except for absence of the gallbladder. Musculoskeletal: There is a soft tissue lesion along the left anterior mid chest wall slightly to the left of midline with soft tissue stranding in this area. This lesion measures 4.1 x 3.3 x 2.5 cm. It does not contain air. No similar appearing lesions are identified elsewhere. There is degenerative change in the mid thoracic spine region, most notably at T6-7 where there is extensive endplate irregularity on both sides of the disc. There are no blastic or lytic bone lesions. IMPRESSION: Soft tissue lesion along the anterior chest wall superficially just to the left of midline in the mid chest region measuring 4.1 x 3.3 x 2.5 cm. This lesion does not contain air but does have irregular borders and has an inflammatory appearing etiology. No well-defined fluid level is seen in this lesion. Similar lesions are not noted elsewhere. Marked disc degeneration at T6-7. This focal severe degenerative type change in the absence of similar change elsewhere raises question of prior infection in this area with residual endplate irregularity and disc degeneration. No paraspinous lesion is appreciable in this region. Patchy atelectasis in both lower lung zones. No edema or  consolidation. No adenopathy. Small hiatal hernia. Scattered foci of aortic atherosclerosis. Scattered foci of coronary artery calcification noted. Electronically Signed   By: Lowella Grip III M.D.   On: 10/10/2015 14:40   Dg Chest Port 1 View  10/10/2015  CLINICAL DATA:  Chest pain. EXAM: PORTABLE CHEST 1 VIEW COMPARISON:  Radiograph of Jul 31, 2014. FINDINGS: The heart size and mediastinal contours are within normal limits. Both lungs are clear. No pneumothorax or pleural effusion is noted. The visualized skeletal structures are unremarkable. IMPRESSION: No acute cardiopulmonary abnormality seen. Electronically Signed   By: Marijo Conception, M.D.   On: 10/10/2015 12:59    Review of Systems  Constitutional: Negative for fever and chills.  Respiratory: Negative for shortness of breath.   Cardiovascular: Positive for chest pain (Left breast).   Blood pressure 115/71, pulse 67, temperature 96.2 F (35.7 C), temperature source Rectal, resp. rate 15, height '5\' 8"'$  (1.727 m), weight 116.574 kg (257 lb), SpO2 97 %. Physical Exam  Constitutional: She appears well-developed and well-nourished. No distress.  HENT:  Head: Normocephalic.  Eyes: Conjunctivae are normal.  Cardiovascular: Normal rate, regular rhythm and normal heart sounds.  Exam reveals no gallop and no friction rub.   No murmur heard. Respiratory: Effort normal and breath sounds normal. No respiratory distress. She has no wheezes. She has no rales. She exhibits tenderness.    GI: Soft.  Lymphadenopathy:    She has no cervical adenopathy.  Neurological: She is alert.  Skin: Skin is warm and dry. She is not diaphoretic.  Psychiatric: She has a normal mood and affect. Her behavior is normal.    Assessment/Plan: Left breast abscess -- Agree with vancomycin until cultures returned. Will ask IR for US guided aspiration.  Multiple medical problems -- per primary team, recommend good glucose control    Sarah Abu,  PA-C Pager: [phone]  10/10/2015, 4:35 PM

## 2015-10-10 NOTE — H&P (Signed)
History and Physical    Sarah Gallegos [id number] DOB: 1960/06/19 DOA: 10/10/2015  PCP: Philis Fendt, MD Consultants:  [facility] Patient coming from: home,lives with husband  Chief Complaint: abscess  HPI: Sarah Gallegos is a 55 y.o. female with medical history significant of DM, HTN, HLD, neuropathy presenting with abscess underneath left breast. Patient reports that she had a routine appt scheduled with her PCP today anyway.  Boil has been coming on all week and last night it was so big it burst.  Went in to appt feeling weak and stumbled to get in.  BP very low, glucose 75, got IVF in office and still with low BP and sent to ER.  ED Course: BP 109/75 after 1.5L IVF in PCP office and en route.  Lethargic, rectal temp 96.2, RR 22.  1L IVF and Vanc for presumed sepsis secondary to abscess.  Improved with IVF and antibiotics. Hospitalist called for admission.  Review of Systems: As per HPI; otherwise 10 point review of systems reviewed and negative.   Ambulatory Status: Ambulatory  Past Medical History  Diagnosis Date  . Diabetes mellitus without complication ([place])   . Hypertension   . Hypercholesteremia   . Neuropathy ([place])   . Hypothyroidism     Past Surgical History  Procedure Laterality Date  . Back surgery      2003, [facility], [place], [place]  . Gallbladder surgery      2003, [facility], [place], [place]  . Breast biopsy      2003, [facility], [place], [place]  . Abdominal hysterectomy      2008  . Eye surgery      2010,  for graves disease. [facility] [place], [place]    Social History   Social History  . Marital Status: Married    Spouse Name: N/A  . Number of Children: N/A  . Years of Education: N/A   Occupational History  . unemployed    Social History Main Topics  . Smoking status: Former Smoker -- 1.00 packs/day for 7 years    Types: Cigarettes  . Smokeless tobacco: Not on file     Comment: quit in 1987  . Alcohol Use: No    . Drug Use: No  . Sexual Activity: Not on file   Other Topics Concern  . Not on file   Social History Narrative    Allergies  Allergen Reactions  . Lantus [Insulin Glargine] Rash and Other (See Comments)    Face breaks out really bad  . Tradjenta [Linagliptin] Rash and Other (See Comments)    Face breaks out    Family History  Problem Relation Age of Onset  . Heart failure Mother   . Stroke Mother   . Hypertension Father     Prior to Admission medications   Medication Sig Start Date End Date Taking? Authorizing Provider  acetaminophen-codeine 120-12 MG/5ML solution Take 10 mLs by mouth every 4 (four) hours as needed for moderate pain. 07/31/14   Dalia Heading, PA-C  cyclobenzaprine (FLEXERIL) 10 MG tablet Take 1 tablet (10 mg total) by mouth 2 (two) times daily as needed for muscle spasms. 07/27/15   Recardo Evangelist, PA-C  dapagliflozin propanediol (FARXIGA) 10 MG TABS tablet Take 10 mg by mouth daily.    Historical Provider, MD  furosemide (LASIX) 20 MG tablet Take 20 mg by mouth daily.    Historical Provider, MD  gabapentin (NEURONTIN) 300 MG capsule Take 300-600 mg by mouth 2 (two)  times daily. TAKES 1 CAP IN AM AND 2 CAPS AT BEDTIME    Historical Provider, MD  Guaifenesin 1200 MG TB12 Take 1 tablet (1,200 mg total) by mouth 2 (two) times daily. 07/31/14   Dalia Heading, PA-C  hydrochlorothiazide (HYDRODIURIL) 25 MG tablet Take 25 mg by mouth daily. 07/20/14   Historical Provider, MD  HYDROcodone-acetaminophen (NORCO/VICODIN) 5-325 MG per tablet Take 1 tablet by mouth 2 (two) times daily as needed for moderate pain.     Historical Provider, MD  HYDROcodone-acetaminophen (NORCO/VICODIN) 5-325 MG tablet Take 2 tablets by mouth every 4 (four) hours as needed. 07/27/15   Recardo Evangelist, PA-C  KLOR-CON M10 10 MEQ tablet Take 10 mEq by mouth daily. 06/26/14   Historical Provider, MD  levofloxacin (LEVAQUIN) 750 MG tablet Take 1 tablet (750 mg total) by mouth daily. 07/31/14    Dalia Heading, PA-C  levothyroxine (SYNTHROID, LEVOTHROID) 200 MCG tablet Take 200 mcg by mouth daily before breakfast.    Historical Provider, MD  lisinopril (PRINIVIL,ZESTRIL) 40 MG tablet Take 40 mg by mouth daily.    Historical Provider, MD  metFORMIN (GLUCOPHAGE) 1000 MG tablet Take 1,000 mg by mouth 2 (two) times daily with a meal.    Historical Provider, MD  NOVOLOG MIX 70/30 FLEXPEN (70-30) 100 UNIT/ML FlexPen Inject 40-50 Units into the skin 2 (two) times daily. TAKES 50 UNITS IN AM AND 40 UNITS IN PM 07/10/15   Historical Provider, MD  omeprazole (PRILOSEC) 40 MG capsule Take 40 mg by mouth daily.    Historical Provider, MD  simvastatin (ZOCOR) 20 MG tablet Take 20 mg by mouth daily at 6 PM.     Historical Provider, MD  zolpidem (AMBIEN) 10 MG tablet Take 10 mg by mouth at bedtime as needed for sleep.    Historical Provider, MD    Physical Exam: Filed Vitals:   10/10/15 1645 10/10/15 1715 10/10/15 1730 10/10/15 1758  BP: 111/71 115/77 105/71   Pulse: 65 64 65   Temp:    97.5 F (36.4 C)  TempSrc:      Resp: 14 9 12    Height:      Weight:      SpO2: 96% 100% 97%      General: Appears calm and comfortable and is NAD Eyes:  PERRL, EOMI, normal lids, iris ENT:  grossly normal hearing, lips & tongue, mmm Neck:  no LAD, masses or thyromegaly Cardiovascular:  RRR, no m/r/g. No LE edema.  Respiratory:  CTA bilaterally, no w/r/r. Normal respiratory effort. Abdomen:  soft, ntnd, NABS Skin:  no rash or induration seen on limited exam Musculoskeletal:  grossly normal tone BUE/BLE, good ROM, no bony abnormality Psychiatric:  grossly normal mood and affect, speech fluent and appropriate, AOx3 Neurologic:  CN 2-12 grossly intact, moves all extremities in coordinated fashion, sensation intact Left breast with fluctuant abscess approximately 4 cm underneath breast at bra line, some purulent drainage, mild surrounding erythema  Labs on Admission: I have personally reviewed following  labs and imaging studies  CBC:  Recent Labs Lab 10/10/15 1230  WBC 8.3  NEUTROABS 5.9  HGB 16.5*  HCT 49.7*  MCV 83.0  PLT 99991111   Basic Metabolic Panel:  Recent Labs Lab 10/10/15 1230  NA 138  K 3.2*  CL 104  CO2 27  GLUCOSE 76  BUN 28*  CREATININE 1.02*  CALCIUM 8.8*   GFR: Estimated Creatinine Clearance: 84.6 mL/min (by C-G formula based on Cr of 1.02). Liver Function Tests:  Recent  Labs Lab 10/10/15 1230  AST 15  ALT 15  ALKPHOS 80  BILITOT 0.6  PROT 6.2*  ALBUMIN 2.9*   No results for input(s): LIPASE, AMYLASE in the last 168 hours. No results for input(s): AMMONIA in the last 168 hours. Coagulation Profile: No results for input(s): INR, PROTIME in the last 168 hours. Cardiac Enzymes:  Recent Labs Lab 10/10/15 1400  TROPONINI <0.03   BNP (last 3 results) No results for input(s): PROBNP in the last 8760 hours. HbA1C: No results for input(s): HGBA1C in the last 72 hours. CBG: No results for input(s): GLUCAP in the last 168 hours. Lipid Profile: No results for input(s): CHOL, HDL, LDLCALC, TRIG, CHOLHDL, LDLDIRECT in the last 72 hours. Thyroid Function Tests:  Recent Labs  10/10/15 1330  TSH 0.277*   Anemia Panel: No results for input(s): VITAMINB12, FOLATE, FERRITIN, TIBC, IRON, RETICCTPCT in the last 72 hours. Urine analysis:    Component Value Date/Time   COLORURINE YELLOW 07/31/2014 0838   APPEARANCEUR CLEAR 07/31/2014 0838   LABSPEC 1.037* 07/31/2014 0838   PHURINE 5.5 07/31/2014 0838   GLUCOSEU >1000* 07/31/2014 0838   HGBUR NEGATIVE 07/31/2014 0838   BILIRUBINUR NEGATIVE 07/31/2014 0838   KETONESUR NEGATIVE 07/31/2014 0838   PROTEINUR NEGATIVE 07/31/2014 0838   UROBILINOGEN 0.2 07/31/2014 0838   NITRITE NEGATIVE 07/31/2014 0838   LEUKOCYTESUR NEGATIVE 07/31/2014 0838    Creatinine Clearance: Estimated Creatinine Clearance: 84.6 mL/min (by C-G formula based on Cr of 1.02).  Sepsis  Labs: @LABRCNTIP (procalcitonin:4,lacticidven:4) )No results found for this or any previous visit (from the past 240 hour(s)).   Radiological Exams on Admission: Ct Head Wo Contrast  10/10/2015  CLINICAL DATA:  Left site headache EXAM: CT HEAD WITHOUT CONTRAST TECHNIQUE: Contiguous axial images were obtained from the base of the skull through the vertex without intravenous contrast. COMPARISON:  01/07/2009 FINDINGS: No skull fracture is noted. Bilateral exophthalmos again noted without change from prior exam. Paranasal sinuses and mastoid air cells are unremarkable. No intracranial hemorrhage, mass effect or midline shift. No acute cortical infarction. No mass lesion is noted on this unenhanced scan. Mild atherosclerotic calcifications of carotid siphon. Ventricular size is stable from prior exam. IMPRESSION: No acute intracranial abnormality.  No significant change. Electronically Signed   By: Lahoma Crocker M.D.   On: 10/10/2015 14:34   Ct Chest W Contrast  10/10/2015  CLINICAL DATA:  Chest pain.  Soft tissue abscess. EXAM: CT CHEST WITH CONTRAST TECHNIQUE: Multidetector CT imaging of the chest was performed during intravenous contrast administration. CONTRAST:  88mL ISOVUE-300 IOPAMIDOL (ISOVUE-300) INJECTION 61% COMPARISON:  Chest radiograph October 10, 2015 FINDINGS: Mediastinum/Lymph Nodes: Thyroid appears normal. There is no appreciable thoracic adenopathy. There is a small hiatal hernia. There is air throughout much of the esophagus. There is no thoracic aortic aneurysm or dissection. No pulmonary embolus is identified in the major vessels. There are scattered foci of atherosclerotic calcification in the aorta. There are scattered foci of coronary artery calcification. The pericardium is not appreciably thickened. Lungs/Pleura: There is patchy atelectasis in the lower lobes bilaterally, slightly more on the right than on the left. There is no appreciable airspace consolidation or edema. Upper abdomen:  Visualized upper abdominal structures appear unremarkable except for absence of the gallbladder. Musculoskeletal: There is a soft tissue lesion along the left anterior mid chest wall slightly to the left of midline with soft tissue stranding in this area. This lesion measures 4.1 x 3.3 x 2.5 cm. It does not contain air. No similar  appearing lesions are identified elsewhere. There is degenerative change in the mid thoracic spine region, most notably at T6-7 where there is extensive endplate irregularity on both sides of the disc. There are no blastic or lytic bone lesions. IMPRESSION: Soft tissue lesion along the anterior chest wall superficially just to the left of midline in the mid chest region measuring 4.1 x 3.3 x 2.5 cm. This lesion does not contain air but does have irregular borders and has an inflammatory appearing etiology. No well-defined fluid level is seen in this lesion. Similar lesions are not noted elsewhere. Marked disc degeneration at T6-7. This focal severe degenerative type change in the absence of similar change elsewhere raises question of prior infection in this area with residual endplate irregularity and disc degeneration. No paraspinous lesion is appreciable in this region. Patchy atelectasis in both lower lung zones. No edema or consolidation. No adenopathy. Small hiatal hernia. Scattered foci of aortic atherosclerosis. Scattered foci of coronary artery calcification noted. Electronically Signed   By: Lowella Grip III M.D.   On: 10/10/2015 14:40   Dg Chest Port 1 View  10/10/2015  CLINICAL DATA:  Chest pain. EXAM: PORTABLE CHEST 1 VIEW COMPARISON:  Radiograph of Jul 31, 2014. FINDINGS: The heart size and mediastinal contours are within normal limits. Both lungs are clear. No pneumothorax or pleural effusion is noted. The visualized skeletal structures are unremarkable. IMPRESSION: No acute cardiopulmonary abnormality seen. Electronically Signed   By: Marijo Conception, M.D.   On:  10/10/2015 12:59    EKG: Independently reviewed.  NSR; nonspecific ST changes with no evidence of acute ischemia, NSCSLT  Assessment/Plan Principal Problem:   Sepsis associated hypotension (HCC) Active Problems:   Cellulitis and abscess   Type 2 diabetes mellitus (HCC)   Essential hypertension   Hyperlipidemia   Neuropathy due to type 2 diabetes mellitus (HCC)    Sepsis with hypotension -Diagnosis as per ED physician -Improved following IVF -Has no other criteria indicative of sepsis but does have a source so this diagnosis is questionable -Normal lactate -Blood and urine cultures pending  Abscess underneath left breast -Gen surg consult for likely I&D -Vancomycin and Zosyn until cultures are available -Pain control with morphine as needed  DM -Hold oral medications and start SSI -Continue 70/30 -No recent A1c, will check  HTN -Hypotensive on presentation -Hold home medications for now  Neuropathy -Continue gabapentin  Hypothyroidism -TSH is low -Will check free T4 -May need decrease in Synthroid dose   DVT prophylaxis:  Lovenox Code Status: Full - confirmed with patient Family Communication: None present Disposition Plan: Home once clinically improved Consults called: Gen surg Admission status:  Observation, med surg   Karmen Bongo MD [facility]  If 7PM-7AM, please contact night-coverage [email or web address] Password [facility]  10/10/2015, 6:24 PM

## 2015-10-10 NOTE — Progress Notes (Signed)
Pharmacy Antibiotic Note  Sarah Gallegos is a 55 y.o. female admitted on 10/10/2015 with breast abscess.  Pharmacy has been consulted for Vancomycin and Zosyn  dosing.  Vancomycin 2 gram doses given at 2 pm IR guided aspiration planned   Plan: Zosyn 3.375 grams iv Q 8 hours starting now Vancomycin 1250 mg iv Q 12 hours starting at midnight  Height: 5\' 8"  (172.7 cm) Weight: 257 lb (116.574 kg) IBW/kg (Calculated) : 63.9  Temp (24hrs), Avg:97.1 F (36.2 C), Min:96.2 F (35.7 C), Max:97.6 F (36.4 C)   Recent Labs Lab 10/10/15 1230 10/10/15 1239 10/10/15 1535  WBC 8.3  --   --   CREATININE 1.02*  --   --   LATICACIDVEN  --  1.19 0.79    Estimated Creatinine Clearance: 84.6 mL/min (by C-G formula based on Cr of 1.02).    Allergies  Allergen Reactions  . Lantus [Insulin Glargine] Rash and Other (See Comments)    Face breaks out really bad  . Tradjenta [Linagliptin] Rash and Other (See Comments)    Face breaks out     Thank you for allowing pharmacy to be a part of this patient's care. Anette Guarneri, PharmD [phone] 10/10/2015 6:47 PM

## 2015-10-11 ENCOUNTER — Encounter (HOSPITAL_COMMUNITY): Payer: Self-pay | Admitting: Certified Registered Nurse Anesthetist

## 2015-10-11 ENCOUNTER — Encounter (HOSPITAL_COMMUNITY)
Admission: EM | Disposition: A | Discharge status: Home / Self Care | Payer: Self-pay | Source: Home / Self Care | Attending: Internal Medicine

## 2015-10-11 ENCOUNTER — Observation Stay (HOSPITAL_COMMUNITY): Payer: Medicare PPO

## 2015-10-11 DIAGNOSIS — E119 Type 2 diabetes mellitus without complications: Secondary | ICD-10-CM | POA: Diagnosis not present

## 2015-10-11 DIAGNOSIS — E785 Hyperlipidemia, unspecified: Secondary | ICD-10-CM | POA: Diagnosis present

## 2015-10-11 DIAGNOSIS — E7800 Pure hypercholesterolemia, unspecified: Secondary | ICD-10-CM | POA: Diagnosis present

## 2015-10-11 DIAGNOSIS — I10 Essential (primary) hypertension: Secondary | ICD-10-CM

## 2015-10-11 DIAGNOSIS — E1140 Type 2 diabetes mellitus with diabetic neuropathy, unspecified: Secondary | ICD-10-CM | POA: Diagnosis present

## 2015-10-11 DIAGNOSIS — L0291 Cutaneous abscess, unspecified: Secondary | ICD-10-CM

## 2015-10-11 DIAGNOSIS — E039 Hypothyroidism, unspecified: Secondary | ICD-10-CM | POA: Diagnosis present

## 2015-10-11 DIAGNOSIS — Z87891 Personal history of nicotine dependence: Secondary | ICD-10-CM | POA: Diagnosis not present

## 2015-10-11 DIAGNOSIS — Z794 Long term (current) use of insulin: Secondary | ICD-10-CM | POA: Diagnosis not present

## 2015-10-11 DIAGNOSIS — Z79899 Other long term (current) drug therapy: Secondary | ICD-10-CM | POA: Diagnosis not present

## 2015-10-11 DIAGNOSIS — A419 Sepsis, unspecified organism: Secondary | ICD-10-CM | POA: Diagnosis present

## 2015-10-11 DIAGNOSIS — N611 Abscess of the breast and nipple: Secondary | ICD-10-CM | POA: Diagnosis present

## 2015-10-11 DIAGNOSIS — L0390 Cellulitis, unspecified: Secondary | ICD-10-CM | POA: Diagnosis not present

## 2015-10-11 DIAGNOSIS — Z888 Allergy status to other drugs, medicaments and biological substances status: Secondary | ICD-10-CM | POA: Diagnosis not present

## 2015-10-11 DIAGNOSIS — Z8249 Family history of ischemic heart disease and other diseases of the circulatory system: Secondary | ICD-10-CM | POA: Diagnosis not present

## 2015-10-11 LAB — BASIC METABOLIC PANEL
ANION GAP: 9 (ref 5–15)
BUN: 18 mg/dL (ref 6–20)
CALCIUM: 8.7 mg/dL — AB (ref 8.9–10.3)
CHLORIDE: 105 mmol/L (ref 101–111)
CO2: 27 mmol/L (ref 22–32)
Creatinine, Ser: 0.82 mg/dL (ref 0.44–1.00)
GFR calc non Af Amer: >60 mL/min (ref >60)
GLUCOSE: 66 mg/dL (ref 65–99)
POTASSIUM: 3.6 mmol/L (ref 3.5–5.1)
Sodium: 141 mmol/L (ref 135–145)

## 2015-10-11 LAB — GLUCOSE, CAPILLARY
GLUCOSE-CAPILLARY: 76 mg/dL (ref 65–99)
GLUCOSE-CAPILLARY: 178 mg/dL — AB (ref 65–99)
GLUCOSE-CAPILLARY: 104 mg/dL — AB (ref 65–99)
GLUCOSE-CAPILLARY: 138 mg/dL — AB (ref 65–99)

## 2015-10-11 LAB — CBC
HEMATOCRIT: 35.7 % — AB (ref 36.0–46.0)
HEMOGLOBIN: 11.3 g/dL — AB (ref 12.0–15.0)
MCH: 26.6 pg (ref 26.0–34.0)
MCHC: 31.7 g/dL (ref 30.0–36.0)
MCV: 84 fL (ref 78.0–100.0)
Platelets: 382 10*3/uL (ref 150–400)
RBC: 4.25 MIL/uL (ref 3.87–5.11)
RDW: 13.8 % (ref 11.5–15.5)
WBC: 12.7 10*3/uL — ABNORMAL HIGH (ref 4.0–10.5)

## 2015-10-11 SURGERY — INCISION AND DRAINAGE, ABSCESS
Anesthesia: General | Laterality: Left

## 2015-10-11 MED ORDER — LIDOCAINE-EPINEPHRINE 1 %-1:100000 IJ SOLN
INTRAMUSCULAR | Status: AC
  Filled 2015-10-11: qty 1

## 2015-10-11 MED ORDER — LIDOCAINE HCL (PF) 1 % IJ SOLN
INTRAMUSCULAR | Status: AC
  Filled 2015-10-11: qty 10

## 2015-10-11 NOTE — Progress Notes (Signed)
PROGRESS NOTE    Sarah Gallegos  [id number] DOB: May 31, 1960 DOA: 10/10/2015 PCP: Philis Fendt, MD  Outpatient Specialists:   Brief Narrative: 55 y.o. female with medical history significant of DM, HTN, HLD, neuropathy presenting with abscess underneath left breast. Patient reports that she had a routine appt scheduled with her PCP today anyway. Boil has been coming on all week and last night it was so big it burst. Went in to appt feeling weak and stumbled to get in. BP very low, glucose 75, got IVF in office and still with low BP and sent to ER.  Assessment & Plan:   Principal Problem:   Sepsis associated hypotension (HCC) Active Problems:   Cellulitis and abscess   Type 2 diabetes mellitus ([place])   Essential hypertension   Hyperlipidemia   Neuropathy due to type 2 diabetes mellitus ([place])   Hypothyroidism  Sepsis with hypotension - Hypotension has resolved significantly.  Abscess underneath left breast - Abscess drained by IR, but still draining when compressed. Await further input from Surgery. Continue antibiotics.  DM - Optimize.  HTN - Optimize  Neuropathy -Continue gabapentin  Hypothyroidism   DVT prophylaxis: Lovenox Code Status: Full - confirmed with patient Family Communication: None present Disposition Plan: Home once clinically improved Consults called: Gen surg Admission status: Observation, med surg  Subjective: Nil new complaints. Seen alongside patient's Nurse. Abscess still draining while compressed. No fever or chills reported.  Objective: Filed Vitals:   10/10/15 1758 10/10/15 1828 10/10/15 2106 10/11/15 0621  BP:  114/69 131/84 113/73  Pulse:  69 79 79  Temp: 97.5 F (36.4 C)  97.8 F (36.6 C) 98.7 F (37.1 C)  TempSrc:   Oral Oral  Resp:  18 18 16   Height:      Weight:      SpO2:  100% 100% 100%    Intake/Output Summary (Last 24 hours) at 10/11/15 0953 Last data filed at 10/10/15 1434  Gross per 24 hour  Intake   1500  ml  Output      0 ml  Net   1500 ml   Filed Weights   10/10/15 1314  Weight: 116.574 kg (257 lb)    Examination:  General exam: Appears calm and comfortable. Abscess lower chest still draining.  Respiratory system: Clear to auscultation. Respiratory effort normal. Cardiovascular system: S1 & S2 heard. Gastrointestinal system: Abdomen is nondistended, soft and nontender.   Central nervous system: Alert and oriented. No focal neurological deficits. Extremities: No leg edema.   Data Reviewed: I have personally reviewed following labs and imaging studies  CBC:  Recent Labs Lab 10/10/15 1230 10/11/15 0606  WBC 8.3 12.7*  NEUTROABS 5.9  --   HGB 16.5* 11.3*  HCT 49.7* 35.7*  MCV 83.0 84.0  PLT 218 99991111   Basic Metabolic Panel:  Recent Labs Lab 10/10/15 1230 10/11/15 0606  NA 138 141  K 3.2* 3.6  CL 104 105  CO2 27 27  GLUCOSE 76 66  BUN 28* 18  CREATININE 1.02* 0.82  CALCIUM 8.8* 8.7*   GFR: Estimated Creatinine Clearance: 105.2 mL/min (by C-G formula based on Cr of 0.82). Liver Function Tests:  Recent Labs Lab 10/10/15 1230  AST 15  ALT 15  ALKPHOS 80  BILITOT 0.6  PROT 6.2*  ALBUMIN 2.9*   No results for input(s): LIPASE, AMYLASE in the last 168 hours. No results for input(s): AMMONIA in the last 168 hours. Coagulation Profile:  Recent Labs Lab 10/10/15 1859  INR  0.99   Cardiac Enzymes:  Recent Labs Lab 10/10/15 1400  TROPONINI <0.03   BNP (last 3 results) No results for input(s): PROBNP in the last 8760 hours. HbA1C: No results for input(s): HGBA1C in the last 72 hours. CBG:  Recent Labs Lab 10/10/15 2115 10/11/15 0620  GLUCAP 222* 76   Lipid Profile: No results for input(s): CHOL, HDL, LDLCALC, TRIG, CHOLHDL, LDLDIRECT in the last 72 hours. Thyroid Function Tests:  Recent Labs  10/10/15 1330 10/10/15 1859  TSH 0.277*  --   FREET4  --  1.40*   Anemia Panel: No results for input(s): VITAMINB12, FOLATE, FERRITIN, TIBC,  IRON, RETICCTPCT in the last 72 hours. Urine analysis:    Component Value Date/Time   COLORURINE YELLOW 07/31/2014 0838   APPEARANCEUR CLEAR 07/31/2014 0838   LABSPEC 1.037* 07/31/2014 0838   PHURINE 5.5 07/31/2014 0838   GLUCOSEU >1000* 07/31/2014 0838   HGBUR NEGATIVE 07/31/2014 0838   BILIRUBINUR NEGATIVE 07/31/2014 0838   KETONESUR NEGATIVE 07/31/2014 0838   PROTEINUR NEGATIVE 07/31/2014 0838   UROBILINOGEN 0.2 07/31/2014 0838   NITRITE NEGATIVE 07/31/2014 0838   LEUKOCYTESUR NEGATIVE 07/31/2014 0838   Sepsis Labs: @LABRCNTIP (procalcitonin:4,lacticidven:4)  )No results found for this or any previous visit (from the past 240 hour(s)).       Radiology Studies: Ct Head Wo Contrast  10/10/2015  CLINICAL DATA:  Left site headache EXAM: CT HEAD WITHOUT CONTRAST TECHNIQUE: Contiguous axial images were obtained from the base of the skull through the vertex without intravenous contrast. COMPARISON:  01/07/2009 FINDINGS: No skull fracture is noted. Bilateral exophthalmos again noted without change from prior exam. Paranasal sinuses and mastoid air cells are unremarkable. No intracranial hemorrhage, mass effect or midline shift. No acute cortical infarction. No mass lesion is noted on this unenhanced scan. Mild atherosclerotic calcifications of carotid siphon. Ventricular size is stable from prior exam. IMPRESSION: No acute intracranial abnormality.  No significant change. Electronically Signed   By: Lahoma Crocker M.D.   On: 10/10/2015 14:34   Ct Chest W Contrast  10/10/2015  CLINICAL DATA:  Chest pain.  Soft tissue abscess. EXAM: CT CHEST WITH CONTRAST TECHNIQUE: Multidetector CT imaging of the chest was performed during intravenous contrast administration. CONTRAST:  50mL ISOVUE-300 IOPAMIDOL (ISOVUE-300) INJECTION 61% COMPARISON:  Chest radiograph October 10, 2015 FINDINGS: Mediastinum/Lymph Nodes: Thyroid appears normal. There is no appreciable thoracic adenopathy. There is a small hiatal  hernia. There is air throughout much of the esophagus. There is no thoracic aortic aneurysm or dissection. No pulmonary embolus is identified in the major vessels. There are scattered foci of atherosclerotic calcification in the aorta. There are scattered foci of coronary artery calcification. The pericardium is not appreciably thickened. Lungs/Pleura: There is patchy atelectasis in the lower lobes bilaterally, slightly more on the right than on the left. There is no appreciable airspace consolidation or edema. Upper abdomen: Visualized upper abdominal structures appear unremarkable except for absence of the gallbladder. Musculoskeletal: There is a soft tissue lesion along the left anterior mid chest wall slightly to the left of midline with soft tissue stranding in this area. This lesion measures 4.1 x 3.3 x 2.5 cm. It does not contain air. No similar appearing lesions are identified elsewhere. There is degenerative change in the mid thoracic spine region, most notably at T6-7 where there is extensive endplate irregularity on both sides of the disc. There are no blastic or lytic bone lesions. IMPRESSION: Soft tissue lesion along the anterior chest wall superficially just to the left of  midline in the mid chest region measuring 4.1 x 3.3 x 2.5 cm. This lesion does not contain air but does have irregular borders and has an inflammatory appearing etiology. No well-defined fluid level is seen in this lesion. Similar lesions are not noted elsewhere. Marked disc degeneration at T6-7. This focal severe degenerative type change in the absence of similar change elsewhere raises question of prior infection in this area with residual endplate irregularity and disc degeneration. No paraspinous lesion is appreciable in this region. Patchy atelectasis in both lower lung zones. No edema or consolidation. No adenopathy. Small hiatal hernia. Scattered foci of aortic atherosclerosis. Scattered foci of coronary artery calcification  noted. Electronically Signed   By: Lowella Grip III M.D.   On: 10/10/2015 14:40   Dg Chest Port 1 View  10/10/2015  CLINICAL DATA:  Chest pain. EXAM: PORTABLE CHEST 1 VIEW COMPARISON:  Radiograph of Jul 31, 2014. FINDINGS: The heart size and mediastinal contours are within normal limits. Both lungs are clear. No pneumothorax or pleural effusion is noted. The visualized skeletal structures are unremarkable. IMPRESSION: No acute cardiopulmonary abnormality seen. Electronically Signed   By: Marijo Conception, M.D.   On: 10/10/2015 12:59        Scheduled Meds: . docusate sodium  100 mg Oral BID  . enoxaparin (LOVENOX) injection  40 mg Subcutaneous Q24H  . gabapentin  600 mg Oral BID  . insulin aspart  0-20 Units Subcutaneous TID WC  . insulin aspart protamine - aspart  40-50 Units Subcutaneous BID  . levothyroxine  200 mcg Oral QAC breakfast  . lidocaine-EPINEPHrine      . pantoprazole  40 mg Oral Daily  . piperacillin-tazobactam (ZOSYN)  IV  3.375 g Intravenous Q8H  . potassium chloride  10 mEq Oral Daily  . senna  1 tablet Oral BID  . simvastatin  20 mg Oral Daily  . sodium chloride flush  3 mL Intravenous Q12H  . vancomycin  1,250 mg Intravenous Q12H   Continuous Infusions: . lactated ringers 125 mL/hr at 10/10/15 1952     LOS: 1 day    Time spent: 25 Minutes    Dana Allan, MD  [facility] Pager #: [phone] 7PM-7AM contact night coverage as above

## 2015-10-11 NOTE — Progress Notes (Signed)
[facility] Progress Note    Subjective: Patient eating breakfast.  Has not had any procedure to drain her abscess.  Objective: Vital signs in last 24 hours: Temp:  [96.2 F (35.7 C)-98.7 F (37.1 C)] 98.7 F (37.1 C) (07/15 0621) Pulse Rate:  [62-79] 79 (07/15 0621) Resp:  [9-23] 16 (07/15 0621) BP: (87-131)/(53-84) 113/73 mmHg (07/15 0621) SpO2:  [92 %-100 %] 100 % (07/15 0621) Weight:  [116.574 kg (257 lb)] 116.574 kg (257 lb) (07/14 1314) Last BM Date: 10/09/15  Intake/Output from previous day: 07/14 0701 - 07/15 0700 In: 1500 [I.V.:1500] Out: -  Intake/Output this shift:    General: No acute distress.  Just ate breakfast.  Lungs: Clear to auscultation  Abd: Benign.  Extremities: No changes  Neuro: Intact  Breast:  Inner medial chest wall near interface of left breast with the chest, hard abscess and tenderness.  Needs to be drained.  Lab Results:  @LABLAST2 (wbc:2,hgb:2,hct:2,plt:2) BMET ) Recent Labs  10/10/15 1230 10/11/15 0606  NA 138 141  K 3.2* 3.6  CL 104 105  CO2 27 27  GLUCOSE 76 66  BUN 28* 18  CREATININE 1.02* 0.82  CALCIUM 8.8* 8.7*   PT/INR  Recent Labs  10/10/15 1859  LABPROT 13.3  INR 0.99   ABG No results for input(s): PHART, HCO3 in the last 72 hours.  Invalid input(s): PCO2, PO2  Studies/Results: Ct Head Wo Contrast  10/10/2015  CLINICAL DATA:  Left site headache EXAM: CT HEAD WITHOUT CONTRAST TECHNIQUE: Contiguous axial images were obtained from the base of the skull through the vertex without intravenous contrast. COMPARISON:  01/07/2009 FINDINGS: No skull fracture is noted. Bilateral exophthalmos again noted without change from prior exam. Paranasal sinuses and mastoid air cells are unremarkable. No intracranial hemorrhage, mass effect or midline shift. No acute cortical infarction. No mass lesion is noted on this unenhanced scan. Mild atherosclerotic calcifications of carotid siphon. Ventricular size is stable from prior  exam. IMPRESSION: No acute intracranial abnormality.  No significant change. Electronically Signed   By: Lahoma Crocker M.D.   On: 10/10/2015 14:34   Ct Chest W Contrast  10/10/2015  CLINICAL DATA:  Chest pain.  Soft tissue abscess. EXAM: CT CHEST WITH CONTRAST TECHNIQUE: Multidetector CT imaging of the chest was performed during intravenous contrast administration. CONTRAST:  26mL ISOVUE-300 IOPAMIDOL (ISOVUE-300) INJECTION 61% COMPARISON:  Chest radiograph October 10, 2015 FINDINGS: Mediastinum/Lymph Nodes: Thyroid appears normal. There is no appreciable thoracic adenopathy. There is a small hiatal hernia. There is air throughout much of the esophagus. There is no thoracic aortic aneurysm or dissection. No pulmonary embolus is identified in the major vessels. There are scattered foci of atherosclerotic calcification in the aorta. There are scattered foci of coronary artery calcification. The pericardium is not appreciably thickened. Lungs/Pleura: There is patchy atelectasis in the lower lobes bilaterally, slightly more on the right than on the left. There is no appreciable airspace consolidation or edema. Upper abdomen: Visualized upper abdominal structures appear unremarkable except for absence of the gallbladder. Musculoskeletal: There is a soft tissue lesion along the left anterior mid chest wall slightly to the left of midline with soft tissue stranding in this area. This lesion measures 4.1 x 3.3 x 2.5 cm. It does not contain air. No similar appearing lesions are identified elsewhere. There is degenerative change in the mid thoracic spine region, most notably at T6-7 where there is extensive endplate irregularity on both sides of the disc. There are no blastic or lytic bone lesions. IMPRESSION:  Soft tissue lesion along the anterior chest wall superficially just to the left of midline in the mid chest region measuring 4.1 x 3.3 x 2.5 cm. This lesion does not contain air but does have irregular borders and has an  inflammatory appearing etiology. No well-defined fluid level is seen in this lesion. Similar lesions are not noted elsewhere. Marked disc degeneration at T6-7. This focal severe degenerative type change in the absence of similar change elsewhere raises question of prior infection in this area with residual endplate irregularity and disc degeneration. No paraspinous lesion is appreciable in this region. Patchy atelectasis in both lower lung zones. No edema or consolidation. No adenopathy. Small hiatal hernia. Scattered foci of aortic atherosclerosis. Scattered foci of coronary artery calcification noted. Electronically Signed   By: Lowella Grip III M.D.   On: 10/10/2015 14:40   Dg Chest Port 1 View  10/10/2015  CLINICAL DATA:  Chest pain. EXAM: PORTABLE CHEST 1 VIEW COMPARISON:  Radiograph of Jul 31, 2014. FINDINGS: The heart size and mediastinal contours are within normal limits. Both lungs are clear. No pneumothorax or pleural effusion is noted. The visualized skeletal structures are unremarkable. IMPRESSION: No acute cardiopulmonary abnormality seen. Electronically Signed   By: Marijo Conception, M.D.   On: 10/10/2015 12:59    Anti-infectives: Anti-infectives    Start     Dose/Rate Route Frequency Ordered Stop   10/11/15 0000  vancomycin (VANCOCIN) 1,250 mg in sodium chloride 0.9 % 250 mL IVPB     1,250 mg 166.7 mL/hr over 90 Minutes Intravenous Every 12 hours 10/10/15 1850     10/10/15 1900  piperacillin-tazobactam (ZOSYN) IVPB 3.375 g  Status:  Discontinued     3.375 g 100 mL/hr over 30 Minutes Intravenous  Once 10/10/15 1836 10/10/15 1850   10/10/15 1900  piperacillin-tazobactam (ZOSYN) IVPB 3.375 g     3.375 g 12.5 mL/hr over 240 Minutes Intravenous Every 8 hours 10/10/15 1850     10/10/15 1845  vancomycin (VANCOCIN) IVPB 1000 mg/200 mL premix  Status:  Discontinued     1,000 mg 200 mL/hr over 60 Minutes Intravenous  Once 10/10/15 1836 10/10/15 1842   10/10/15 1345  vancomycin (VANCOCIN)  2,000 mg in sodium chloride 0.9 % 500 mL IVPB     2,000 mg 250 mL/hr over 120 Minutes Intravenous  Once 10/10/15 1331 10/10/15 1744      Assessment/Plan: s/p  NPO  OR for I&D of left breast abscess.  LOS: 1 day   Kathryne Eriksson. Dahlia Bailiff, MD, FACS [phone] [facility] 10/11/2015

## 2015-10-11 NOTE — Progress Notes (Signed)
Changed band aid to aspiration site of left breast.  Patient washed and old drainage at site. No bleeding or drainage. Payton Emerald, RN

## 2015-10-11 NOTE — Procedures (Signed)
Technically successful US guided aspiration of approximately 12 cc of bloody foul smelling fluid from inferior medial aspect of the left breast. EBL: Minimal   No immediate complications.   Ronny Bacon, MD Pager #: [phone]

## 2015-10-12 DIAGNOSIS — E119 Type 2 diabetes mellitus without complications: Secondary | ICD-10-CM

## 2015-10-12 LAB — CBC WITH DIFFERENTIAL/PLATELET
Basophils Absolute: 0 10*3/uL (ref 0.0–0.1)
Basophils Relative: 0 %
Eosinophils Absolute: 0.2 10*3/uL (ref 0.0–0.7)
Eosinophils Relative: 1 %
HCT: 33.2 % — ABNORMAL LOW (ref 36.0–46.0)
Hemoglobin: 10.2 g/dL — ABNORMAL LOW (ref 12.0–15.0)
Lymphocytes Relative: 42 %
Lymphs Abs: 5 10*3/uL — ABNORMAL HIGH (ref 0.7–4.0)
MCH: 26 pg (ref 26.0–34.0)
MCHC: 30.7 g/dL (ref 30.0–36.0)
MCV: 84.7 fL (ref 78.0–100.0)
Monocytes Absolute: 1 10*3/uL (ref 0.1–1.0)
Monocytes Relative: 8 %
Neutro Abs: 5.8 10*3/uL (ref 1.7–7.7)
Neutrophils Relative %: 49 %
Platelets: 363 10*3/uL (ref 150–400)
RBC: 3.92 MIL/uL (ref 3.87–5.11)
RDW: 14.1 % (ref 11.5–15.5)
WBC: 11.9 10*3/uL — ABNORMAL HIGH (ref 4.0–10.5)

## 2015-10-12 LAB — URINE CULTURE

## 2015-10-12 LAB — GLUCOSE, CAPILLARY
GLUCOSE-CAPILLARY: 202 mg/dL — AB (ref 65–99)
Glucose-Capillary: 71 mg/dL (ref 65–99)
Glucose-Capillary: 125 mg/dL — ABNORMAL HIGH (ref 65–99)
Glucose-Capillary: 220 mg/dL — ABNORMAL HIGH (ref 65–99)

## 2015-10-12 MED ORDER — AMOXICILLIN-POT CLAVULANATE 875-125 MG PO TABS: 1.0000 | ORAL_TABLET | Freq: Two times a day (BID) | ORAL | Status: AC

## 2015-10-12 MED ORDER — LISINOPRIL 10 MG PO TABS: 10.0000 mg | ORAL_TABLET | Freq: Every day | ORAL | Status: DC

## 2015-10-12 NOTE — Progress Notes (Signed)
1 Day Post-Op  Subjective: Left breast - still tender, but decreased tenderness from yesterday Still with some drainage  Objective: Vital signs in last 24 hours: Temp:  [98.9 F (37.2 C)-99 F (37.2 C)] 99 F (37.2 C) (07/16 0526) Pulse Rate:  [77-81] 77 (07/16 0526) Resp:  [16] 16 (07/16 0526) BP: (111-117)/(62-78) 111/62 mmHg (07/16 0526) SpO2:  [94 %-100 %] 100 % (07/16 0526) Last BM Date: 10/09/15  Intake/Output from previous day: 07/15 0701 - 07/16 0700 In: 240 [P.O.:240] Out: -  Intake/Output this shift:    General appearance: alert, cooperative and no distress Medial left breast - decreased swelling; still some firmness; minimal drainage  Lab Results:   Recent Labs  10/11/15 0606 10/12/15 0242  WBC 12.7* 11.9*  HGB 11.3* 10.2*  HCT 35.7* 33.2*  PLT 382 363   BMET  Recent Labs  10/10/15 1230 10/11/15 0606  NA 138 141  K 3.2* 3.6  CL 104 105  CO2 27 27  GLUCOSE 76 66  BUN 28* 18  CREATININE 1.02* 0.82  CALCIUM 8.8* 8.7*   PT/INR  Recent Labs  10/10/15 1859  LABPROT 13.3  INR 0.99   ABG No results for input(s): PHART, HCO3 in the last 72 hours.  Invalid input(s): PCO2, PO2  Studies/Results: Ct Head Wo Contrast  10/10/2015  CLINICAL DATA:  Left site headache EXAM: CT HEAD WITHOUT CONTRAST TECHNIQUE: Contiguous axial images were obtained from the base of the skull through the vertex without intravenous contrast. COMPARISON:  01/07/2009 FINDINGS: No skull fracture is noted. Bilateral exophthalmos again noted without change from prior exam. Paranasal sinuses and mastoid air cells are unremarkable. No intracranial hemorrhage, mass effect or midline shift. No acute cortical infarction. No mass lesion is noted on this unenhanced scan. Mild atherosclerotic calcifications of carotid siphon. Ventricular size is stable from prior exam. IMPRESSION: No acute intracranial abnormality.  No significant change. Electronically Signed   By: Lahoma Crocker M.D.   On:  10/10/2015 14:34   Ct Chest W Contrast  10/10/2015  CLINICAL DATA:  Chest pain.  Soft tissue abscess. EXAM: CT CHEST WITH CONTRAST TECHNIQUE: Multidetector CT imaging of the chest was performed during intravenous contrast administration. CONTRAST:  61mL ISOVUE-300 IOPAMIDOL (ISOVUE-300) INJECTION 61% COMPARISON:  Chest radiograph October 10, 2015 FINDINGS: Mediastinum/Lymph Nodes: Thyroid appears normal. There is no appreciable thoracic adenopathy. There is a small hiatal hernia. There is air throughout much of the esophagus. There is no thoracic aortic aneurysm or dissection. No pulmonary embolus is identified in the major vessels. There are scattered foci of atherosclerotic calcification in the aorta. There are scattered foci of coronary artery calcification. The pericardium is not appreciably thickened. Lungs/Pleura: There is patchy atelectasis in the lower lobes bilaterally, slightly more on the right than on the left. There is no appreciable airspace consolidation or edema. Upper abdomen: Visualized upper abdominal structures appear unremarkable except for absence of the gallbladder. Musculoskeletal: There is a soft tissue lesion along the left anterior mid chest wall slightly to the left of midline with soft tissue stranding in this area. This lesion measures 4.1 x 3.3 x 2.5 cm. It does not contain air. No similar appearing lesions are identified elsewhere. There is degenerative change in the mid thoracic spine region, most notably at T6-7 where there is extensive endplate irregularity on both sides of the disc. There are no blastic or lytic bone lesions. IMPRESSION: Soft tissue lesion along the anterior chest wall superficially just to the left of midline in the  mid chest region measuring 4.1 x 3.3 x 2.5 cm. This lesion does not contain air but does have irregular borders and has an inflammatory appearing etiology. No well-defined fluid level is seen in this lesion. Similar lesions are not noted elsewhere.  Marked disc degeneration at T6-7. This focal severe degenerative type change in the absence of similar change elsewhere raises question of prior infection in this area with residual endplate irregularity and disc degeneration. No paraspinous lesion is appreciable in this region. Patchy atelectasis in both lower lung zones. No edema or consolidation. No adenopathy. Small hiatal hernia. Scattered foci of aortic atherosclerosis. Scattered foci of coronary artery calcification noted. Electronically Signed   By: Lowella Grip III M.D.   On: 10/10/2015 14:40   US Aspiration  10/11/2015  INDICATION: Palpable painful draining collection within the inferior medial aspect of the left breast worrisome for breast abscess. Please perform ultrasound-guided soft tissue breast ultrasound and aspiration EXAM: 1. LIMITED SOFT TISSUE LEFT BREAST ULTRASOUND 2. ULTRASOUND-GUIDED LEFT BREAST ABSCESS ASPIRATION COMPARISON:  Chest CT - 10/10/2015 MEDICATIONS: The patient is currently admitted to the hospital and receiving intravenous antibiotics. The antibiotics were administered within an appropriate time frame prior to the initiation of the procedure. ANESTHESIA/SEDATION: None CONTRAST:  None COMPLICATIONS: None immediate. PROCEDURE: Informed written consent was obtained from the patient after a discussion of the risks, benefits and alternatives to treatment. Ultrasound scanning of the patient's palpable area of concern demonstrates an approximately 2.9 x 1.9 x 2.6 cm mixed echogenic presumed abscess at the patient's palpable area of concern involving the inferior medial aspect of the left breast. The procedure was planned. A timeout was performed prior to the initiation of the procedure. The skin overlying the inferior medial aspect of the left breast was prepped and draped in the usual sterile fashion. The overlying soft tissues were anesthetized with 1% lidocaine with epinephrine. Under direct ultrasound guidance, a 5 [place]  Yueh sheath needle was utilized to access the subcutaneous breast abscess. Ultrasound image was saved for procedural documentation purposes. Next, approximately 13 cc of bloody, purulent, foul smelling fluid was aspirated from the collection as the Yueh sheath catheter was slowly withdrawn. Postprocedural imaging demonstrates significant reduction in the size of the residual collection now measuring approximately 3.0 x 0.8 cm. A dressing was placed. The patient tolerated the procedure well without immediate postprocedural complication. IMPRESSION: Successful US guided aspiration of approximately 13 cc of bloody, purulent, foul smelling fluid from the subcutaneous abscess within the inferior medial aspect of the left breast. Would recommend outpatient follow-up at the [facility] to ensure complete resolution. Electronically Signed   By: Sandi Mariscal M.D.   On: 10/11/2015 10:47   Dg Chest Port 1 View  10/10/2015  CLINICAL DATA:  Chest pain. EXAM: PORTABLE CHEST 1 VIEW COMPARISON:  Radiograph of Jul 31, 2014. FINDINGS: The heart size and mediastinal contours are within normal limits. Both lungs are clear. No pneumothorax or pleural effusion is noted. The visualized skeletal structures are unremarkable. IMPRESSION: No acute cardiopulmonary abnormality seen. Electronically Signed   By: Marijo Conception, M.D.   On: 10/10/2015 12:59   US Breast Complete Uni Left Inc Axilla  10/11/2015  INDICATION: Palpable painful draining collection within the inferior medial aspect of the left breast worrisome for breast abscess. Please perform ultrasound-guided soft tissue breast ultrasound and aspiration EXAM: 1. LIMITED SOFT TISSUE LEFT BREAST ULTRASOUND 2. ULTRASOUND-GUIDED LEFT BREAST ABSCESS ASPIRATION COMPARISON:  Chest CT - 10/10/2015 MEDICATIONS: The patient is currently  admitted to the hospital and receiving intravenous antibiotics. The antibiotics were administered within an appropriate time frame prior to the  initiation of the procedure. ANESTHESIA/SEDATION: None CONTRAST:  None COMPLICATIONS: None immediate. PROCEDURE: Informed written consent was obtained from the patient after a discussion of the risks, benefits and alternatives to treatment. Ultrasound scanning of the patient's palpable area of concern demonstrates an approximately 2.9 x 1.9 x 2.6 cm mixed echogenic presumed abscess at the patient's palpable area of concern involving the inferior medial aspect of the left breast. The procedure was planned. A timeout was performed prior to the initiation of the procedure. The skin overlying the inferior medial aspect of the left breast was prepped and draped in the usual sterile fashion. The overlying soft tissues were anesthetized with 1% lidocaine with epinephrine. Under direct ultrasound guidance, a 5 [place] Yueh sheath needle was utilized to access the subcutaneous breast abscess. Ultrasound image was saved for procedural documentation purposes. Next, approximately 13 cc of bloody, purulent, foul smelling fluid was aspirated from the collection as the Yueh sheath catheter was slowly withdrawn. Postprocedural imaging demonstrates significant reduction in the size of the residual collection now measuring approximately 3.0 x 0.8 cm. A dressing was placed. The patient tolerated the procedure well without immediate postprocedural complication. IMPRESSION: Successful US guided aspiration of approximately 13 cc of bloody, purulent, foul smelling fluid from the subcutaneous abscess within the inferior medial aspect of the left breast. Would recommend outpatient follow-up at the [facility] to ensure complete resolution. Electronically Signed   By: Sandi Mariscal M.D.   On: 10/11/2015 10:47    Anti-infectives: Anti-infectives    Start     Dose/Rate Route Frequency Ordered Stop   10/11/15 0000  vancomycin (VANCOCIN) 1,250 mg in sodium chloride 0.9 % 250 mL IVPB     1,250 mg 166.7 mL/hr over 90 Minutes  Intravenous Every 12 hours 10/10/15 1850     10/10/15 1900  piperacillin-tazobactam (ZOSYN) IVPB 3.375 g  Status:  Discontinued     3.375 g 100 mL/hr over 30 Minutes Intravenous  Once 10/10/15 1836 10/10/15 1850   10/10/15 1900  piperacillin-tazobactam (ZOSYN) IVPB 3.375 g     3.375 g 12.5 mL/hr over 240 Minutes Intravenous Every 8 hours 10/10/15 1850     10/10/15 1845  vancomycin (VANCOCIN) IVPB 1000 mg/200 mL premix  Status:  Discontinued     1,000 mg 200 mL/hr over 60 Minutes Intravenous  Once 10/10/15 1836 10/10/15 1842   10/10/15 1345  vancomycin (VANCOCIN) 2,000 mg in sodium chloride 0.9 % 500 mL IVPB     2,000 mg 250 mL/hr over 120 Minutes Intravenous  Once 10/10/15 1331 10/10/15 1744      Assessment/Plan: s/p aspiration of left breast abscess OK for discharge on PO Augmentin  May use warm compressed over the wound Apply dry dressing over wound - change daily Follow-up 1 week at [facility] to repeat ultrasound They will refer to [facility] if needed  LOS: 2 days    Sarah Gallegos K. 10/12/2015

## 2015-10-12 NOTE — Progress Notes (Signed)
Pt given discharge instructions, medication lists, follow up appointments, and when to call the doctor.  Pt verbalizes understanding. Malcom Selmer McClintock, RN  

## 2015-10-12 NOTE — Discharge Summary (Signed)
Physician Discharge Summary  Sarah Gallegos [id number] DOB: 16-Dec-1960 DOA: 10/10/2015  PCP: Philis Fendt, MD  Admit date: 10/10/2015 Discharge date: 10/12/2015  Time spent: Greater than 30 minutes  Recommendations for Outpatient Follow-up:  1. Follow up with PCP within one week. 2. Continue wound dressing. 3. Continue to monitor BP at home. Please call PCP for SBP consistently greater than 144mmHg (as the BP Medications have been adjusted)   Discharge Diagnoses:  Principal Problem:   Sepsis associated hypotension ([place]) Active Problems:   Cellulitis and abscess   Type 2 diabetes mellitus ([place])   Essential hypertension   Hyperlipidemia   Neuropathy due to type 2 diabetes mellitus ([place])   Hypothyroidism   Discharge Condition: Stable  Diet recommendation: Diabetic/Cardiac  Filed Weights   10/10/15 1314  Weight: 116.574 kg (257 lb)    History of present illness: 55 y.o. female with medical history significant for DM, HTN, HLD, neuropathy admitted with abscess underneath left breast. The boil was said to have started about a week prior to presentation but became worse on the day of presentation. On presentation, patient was noted to have a low blood pressure.    Hospital Course: Patient was admitted for further assessment and management. The abscess was drained by IR. Surgery team was consulted. Patient was managed with broad spectrum antibiotics (Vancomycin and Zosyn), and will be discharged back home on oral Augmentin. Wound cultures are still pending. Patient has been cleared for discharge by the Surgical team.  Procedures:  Drainage of abscess by IR.    Consultations:  Surgery  IR  Discharge Exam: Filed Vitals:   10/11/15 2051 10/12/15 0526  BP: 117/78 111/62  Pulse: 81 77  Temp: 98.9 F (37.2 C) 99 F (37.2 C)  Resp: 16 16    General: Not in distress. AAO X 3 Cardiovascular: S1S2 Respiratory: Clear to auscultation  Discharge  Instructions   Discharge Instructions    Diet - low sodium heart healthy    Complete by:  As directed      Diet Carb Modified    Complete by:  As directed      Discharge instructions    Complete by:  As directed   Follow up with PCP within one week. Monitor BP at home as BP Medication has been adjusted downwards and some stopped. Continue wound care at home. Call PCP for significantly elevated BP (i.e. SBP consistently greater than 146mmHg)     Increase activity slowly    Complete by:  As directed           Current Discharge Medication List    START taking these medications   Details  amoxicillin-clavulanate (AUGMENTIN) 875-125 MG tablet Take 1 tablet by mouth 2 (two) times daily. Qty: 14 tablet, Refills: 0      CONTINUE these medications which have CHANGED   Details  lisinopril (PRINIVIL) 10 MG tablet Take 1 tablet (10 mg total) by mouth daily. Qty: 30 tablet, Refills: 1      CONTINUE these medications which have NOT CHANGED   Details  dapagliflozin propanediol (FARXIGA) 10 MG TABS tablet Take 10 mg by mouth daily.    gabapentin (NEURONTIN) 300 MG capsule Take 600 mg by mouth 2 (two) times daily.     insulin aspart protamine - aspart (NOVOLOG MIX 70/30 FLEXPEN) (70-30) 100 UNIT/ML FlexPen Inject 40-50 Units into the skin 2 (two) times daily. Inject 50 units subcutaneously every morning and 40 units at bedtime    levothyroxine (SYNTHROID, LEVOTHROID)  200 MCG tablet Take 200 mcg by mouth daily before breakfast.    metFORMIN (GLUCOPHAGE) 1000 MG tablet Take 1,000 mg by mouth 2 (two) times daily with a meal.    omeprazole (PRILOSEC) 40 MG capsule Take 40 mg by mouth daily as needed (acid reflux).     simvastatin (ZOCOR) 20 MG tablet Take 20 mg by mouth daily.     zolpidem (AMBIEN) 10 MG tablet Take 10 mg by mouth at bedtime as needed for sleep.      STOP taking these medications     furosemide (LASIX) 20 MG tablet      hydrochlorothiazide (HYDRODIURIL) 25 MG tablet       HYDROcodone-acetaminophen (NORCO/VICODIN) 5-325 MG tablet      potassium chloride (K-DUR,KLOR-CON) 10 MEQ tablet        Allergies  Allergen Reactions  . Lantus [Insulin Glargine] Rash and Other (See Comments)    Face breaks out really bad  . Tradjenta [Linagliptin] Rash and Other (See Comments)    Face breaks out   Follow-up Information    Follow up with [facility] In 1 week.   Specialty:  Diagnostic Radiology   Why:  follow-up aspiration of left breast abscess   Contact information:   [place] [address] [phone]       Follow up with Philis Fendt, MD In 1 week.   Specialty:  Internal Medicine   Contact information:   [address] [place] [address] [phone]        The results of significant diagnostics from this hospitalization (including imaging, microbiology, ancillary and laboratory) are listed below for reference.    Significant Diagnostic Studies: Ct Head Wo Contrast  10/10/2015  CLINICAL DATA:  Left site headache EXAM: CT HEAD WITHOUT CONTRAST TECHNIQUE: Contiguous axial images were obtained from the base of the skull through the vertex without intravenous contrast. COMPARISON:  01/07/2009 FINDINGS: No skull fracture is noted. Bilateral exophthalmos again noted without change from prior exam. Paranasal sinuses and mastoid air cells are unremarkable. No intracranial hemorrhage, mass effect or midline shift. No acute cortical infarction. No mass lesion is noted on this unenhanced scan. Mild atherosclerotic calcifications of carotid siphon. Ventricular size is stable from prior exam. IMPRESSION: No acute intracranial abnormality.  No significant change. Electronically Signed   By: Lahoma Crocker M.D.   On: 10/10/2015 14:34   Ct Chest W Contrast  10/10/2015  CLINICAL DATA:  Chest pain.  Soft tissue abscess. EXAM: CT CHEST WITH CONTRAST TECHNIQUE: Multidetector CT imaging of the chest was  performed during intravenous contrast administration. CONTRAST:  62mL ISOVUE-300 IOPAMIDOL (ISOVUE-300) INJECTION 61% COMPARISON:  Chest radiograph October 10, 2015 FINDINGS: Mediastinum/Lymph Nodes: Thyroid appears normal. There is no appreciable thoracic adenopathy. There is a small hiatal hernia. There is air throughout much of the esophagus. There is no thoracic aortic aneurysm or dissection. No pulmonary embolus is identified in the major vessels. There are scattered foci of atherosclerotic calcification in the aorta. There are scattered foci of coronary artery calcification. The pericardium is not appreciably thickened. Lungs/Pleura: There is patchy atelectasis in the lower lobes bilaterally, slightly more on the right than on the left. There is no appreciable airspace consolidation or edema. Upper abdomen: Visualized upper abdominal structures appear unremarkable except for absence of the gallbladder. Musculoskeletal: There is a soft tissue lesion along the left anterior mid chest wall slightly to the left of midline with soft tissue stranding in  this area. This lesion measures 4.1 x 3.3 x 2.5 cm. It does not contain air. No similar appearing lesions are identified elsewhere. There is degenerative change in the mid thoracic spine region, most notably at T6-7 where there is extensive endplate irregularity on both sides of the disc. There are no blastic or lytic bone lesions. IMPRESSION: Soft tissue lesion along the anterior chest wall superficially just to the left of midline in the mid chest region measuring 4.1 x 3.3 x 2.5 cm. This lesion does not contain air but does have irregular borders and has an inflammatory appearing etiology. No well-defined fluid level is seen in this lesion. Similar lesions are not noted elsewhere. Marked disc degeneration at T6-7. This focal severe degenerative type change in the absence of similar change elsewhere raises question of prior infection in this area with residual  endplate irregularity and disc degeneration. No paraspinous lesion is appreciable in this region. Patchy atelectasis in both lower lung zones. No edema or consolidation. No adenopathy. Small hiatal hernia. Scattered foci of aortic atherosclerosis. Scattered foci of coronary artery calcification noted. Electronically Signed   By: Lowella Grip III M.D.   On: 10/10/2015 14:40   US Aspiration  10/11/2015  INDICATION: Palpable painful draining collection within the inferior medial aspect of the left breast worrisome for breast abscess. Please perform ultrasound-guided soft tissue breast ultrasound and aspiration EXAM: 1. LIMITED SOFT TISSUE LEFT BREAST ULTRASOUND 2. ULTRASOUND-GUIDED LEFT BREAST ABSCESS ASPIRATION COMPARISON:  Chest CT - 10/10/2015 MEDICATIONS: The patient is currently admitted to the hospital and receiving intravenous antibiotics. The antibiotics were administered within an appropriate time frame prior to the initiation of the procedure. ANESTHESIA/SEDATION: None CONTRAST:  None COMPLICATIONS: None immediate. PROCEDURE: Informed written consent was obtained from the patient after a discussion of the risks, benefits and alternatives to treatment. Ultrasound scanning of the patient's palpable area of concern demonstrates an approximately 2.9 x 1.9 x 2.6 cm mixed echogenic presumed abscess at the patient's palpable area of concern involving the inferior medial aspect of the left breast. The procedure was planned. A timeout was performed prior to the initiation of the procedure. The skin overlying the inferior medial aspect of the left breast was prepped and draped in the usual sterile fashion. The overlying soft tissues were anesthetized with 1% lidocaine with epinephrine. Under direct ultrasound guidance, a 5 [place] Yueh sheath needle was utilized to access the subcutaneous breast abscess. Ultrasound image was saved for procedural documentation purposes. Next, approximately 13 cc of bloody,  purulent, foul smelling fluid was aspirated from the collection as the Yueh sheath catheter was slowly withdrawn. Postprocedural imaging demonstrates significant reduction in the size of the residual collection now measuring approximately 3.0 x 0.8 cm. A dressing was placed. The patient tolerated the procedure well without immediate postprocedural complication. IMPRESSION: Successful US guided aspiration of approximately 13 cc of bloody, purulent, foul smelling fluid from the subcutaneous abscess within the inferior medial aspect of the left breast. Would recommend outpatient follow-up at the [facility] to ensure complete resolution. Electronically Signed   By: Sandi Mariscal M.D.   On: 10/11/2015 10:47   Dg Chest Port 1 View  10/10/2015  CLINICAL DATA:  Chest pain. EXAM: PORTABLE CHEST 1 VIEW COMPARISON:  Radiograph of Jul 31, 2014. FINDINGS: The heart size and mediastinal contours are within normal limits. Both lungs are clear. No pneumothorax or pleural effusion is noted. The visualized skeletal structures are unremarkable. IMPRESSION: No acute cardiopulmonary abnormality seen. Electronically Signed  By: Marijo Conception, M.D.   On: 10/10/2015 12:59   US Breast Complete Uni Left Inc Axilla  10/11/2015  INDICATION: Palpable painful draining collection within the inferior medial aspect of the left breast worrisome for breast abscess. Please perform ultrasound-guided soft tissue breast ultrasound and aspiration EXAM: 1. LIMITED SOFT TISSUE LEFT BREAST ULTRASOUND 2. ULTRASOUND-GUIDED LEFT BREAST ABSCESS ASPIRATION COMPARISON:  Chest CT - 10/10/2015 MEDICATIONS: The patient is currently admitted to the hospital and receiving intravenous antibiotics. The antibiotics were administered within an appropriate time frame prior to the initiation of the procedure. ANESTHESIA/SEDATION: None CONTRAST:  None COMPLICATIONS: None immediate. PROCEDURE: Informed written consent was obtained from the patient after a  discussion of the risks, benefits and alternatives to treatment. Ultrasound scanning of the patient's palpable area of concern demonstrates an approximately 2.9 x 1.9 x 2.6 cm mixed echogenic presumed abscess at the patient's palpable area of concern involving the inferior medial aspect of the left breast. The procedure was planned. A timeout was performed prior to the initiation of the procedure. The skin overlying the inferior medial aspect of the left breast was prepped and draped in the usual sterile fashion. The overlying soft tissues were anesthetized with 1% lidocaine with epinephrine. Under direct ultrasound guidance, a 5 [place] Yueh sheath needle was utilized to access the subcutaneous breast abscess. Ultrasound image was saved for procedural documentation purposes. Next, approximately 13 cc of bloody, purulent, foul smelling fluid was aspirated from the collection as the Yueh sheath catheter was slowly withdrawn. Postprocedural imaging demonstrates significant reduction in the size of the residual collection now measuring approximately 3.0 x 0.8 cm. A dressing was placed. The patient tolerated the procedure well without immediate postprocedural complication. IMPRESSION: Successful US guided aspiration of approximately 13 cc of bloody, purulent, foul smelling fluid from the subcutaneous abscess within the inferior medial aspect of the left breast. Would recommend outpatient follow-up at the [facility] to ensure complete resolution. Electronically Signed   By: Sandi Mariscal M.D.   On: 10/11/2015 10:47    Microbiology: Recent Results (from the past 240 hour(s))  Blood Culture (routine x 2)     Status: None (Preliminary result)   Collection Time: 10/10/15 12:45 PM  Result Value Ref Range Status   Specimen Description BLOOD LEFT HAND  Final   Special Requests BOTTLES DRAWN AEROBIC ONLY 5CC  Final   Culture NO GROWTH 1 DAY  Final   Report Status PENDING  Incomplete  Blood Culture (routine  x 2)     Status: None (Preliminary result)   Collection Time: 10/10/15  7:04 PM  Result Value Ref Range Status   Specimen Description BLOOD RIGHT ANTECUBITAL  Final   Special Requests BOTTLES DRAWN AEROBIC AND ANAEROBIC 10 CC  Final   Culture NO GROWTH < 24 HOURS  Final   Report Status PENDING  Incomplete  Urine culture     Status: Abnormal   Collection Time: 10/10/15 10:02 PM  Result Value Ref Range Status   Specimen Description URINE, RANDOM  Final   Special Requests NONE  Final   Culture MULTIPLE SPECIES PRESENT, SUGGEST RECOLLECTION (A)  Final   Report Status 10/12/2015 FINAL  Final  Aerobic/Anaerobic Culture (surgical/deep wound)     Status: None (Preliminary result)   Collection Time: 10/11/15 10:16 AM  Result Value Ref Range Status   Specimen Description ABSCESS LEFT BREAST  Final   Special Requests   Final    POST US GUIDED ASPIRATION OF INFERIOR MEDIAL LEFT  BREAST ABSCESS   Gram Stain   Final    ABUNDANT WBC PRESENT, PREDOMINANTLY PMN ABUNDANT GRAM POSITIVE COCCI IN PAIRS FEW GRAM NEGATIVE RODS    Culture PENDING  Incomplete   Report Status PENDING  Incomplete     Labs: Basic Metabolic Panel:  Recent Labs Lab 10/10/15 1230 10/11/15 0606  NA 138 141  K 3.2* 3.6  CL 104 105  CO2 27 27  GLUCOSE 76 66  BUN 28* 18  CREATININE 1.02* 0.82  CALCIUM 8.8* 8.7*   Liver Function Tests:  Recent Labs Lab 10/10/15 1230  AST 15  ALT 15  ALKPHOS 80  BILITOT 0.6  PROT 6.2*  ALBUMIN 2.9*   No results for input(s): LIPASE, AMYLASE in the last 168 hours. No results for input(s): AMMONIA in the last 168 hours. CBC:  Recent Labs Lab 10/10/15 1230 10/11/15 0606 10/12/15 0242  WBC 8.3 12.7* 11.9*  NEUTROABS 5.9  --  5.8  HGB 16.5* 11.3* 10.2*  HCT 49.7* 35.7* 33.2*  MCV 83.0 84.0 84.7  PLT 218 382 363   Cardiac Enzymes:  Recent Labs Lab 10/10/15 1400  TROPONINI <0.03   BNP: BNP (last 3 results)  Recent Labs  10/10/15 1230  BNP 36.9    ProBNP  (last 3 results) No results for input(s): PROBNP in the last 8760 hours.  CBG:  Recent Labs Lab 10/11/15 1127 10/11/15 1713 10/11/15 2049 10/12/15 0637 10/12/15 1122  GLUCAP 178* 104* 138* 71 125*       Signed:  Dana Allan, MD  [facility] Pager #: [phone] 7PM-7AM contact night coverage as above

## 2015-10-13 LAB — HEMOGLOBIN A1C

## 2015-10-15 LAB — CULTURE, BLOOD (ROUTINE X 2)
CULTURE: NO GROWTH
CULTURE: NO GROWTH

## 2015-10-16 LAB — AEROBIC/ANAEROBIC CULTURE W GRAM STAIN (SURGICAL/DEEP WOUND)

## 2015-10-16 LAB — AEROBIC/ANAEROBIC CULTURE (SURGICAL/DEEP WOUND)

## 2015-11-04 ENCOUNTER — Other Ambulatory Visit: Payer: Self-pay | Admitting: Internal Medicine

## 2015-11-04 ENCOUNTER — Other Ambulatory Visit: Payer: Self-pay

## 2015-11-04 DIAGNOSIS — N611 Abscess of the breast and nipple: Secondary | ICD-10-CM

## 2015-11-11 ENCOUNTER — Other Ambulatory Visit: Payer: Medicare PPO

## 2015-11-13 ENCOUNTER — Ambulatory Visit
Admission: RE | Admit: 2015-11-13 | Discharge: 2015-11-13 | Disposition: A | Discharge status: Home / Self Care | Payer: Medicare PPO | Source: Ambulatory Visit | Attending: Internal Medicine | Admitting: Internal Medicine

## 2015-11-13 DIAGNOSIS — N611 Abscess of the breast and nipple: Secondary | ICD-10-CM

## 2015-12-11 DIAGNOSIS — E113293 Type 2 diabetes mellitus with mild nonproliferative diabetic retinopathy without macular edema, bilateral: Secondary | ICD-10-CM | POA: Diagnosis not present

## 2015-12-11 DIAGNOSIS — I10 Essential (primary) hypertension: Secondary | ICD-10-CM | POA: Diagnosis not present

## 2015-12-11 DIAGNOSIS — H25013 Cortical age-related cataract, bilateral: Secondary | ICD-10-CM | POA: Diagnosis not present

## 2015-12-11 DIAGNOSIS — E039 Hypothyroidism, unspecified: Secondary | ICD-10-CM | POA: Diagnosis not present

## 2015-12-11 DIAGNOSIS — E559 Vitamin D deficiency, unspecified: Secondary | ICD-10-CM | POA: Diagnosis not present

## 2015-12-11 DIAGNOSIS — E1140 Type 2 diabetes mellitus with diabetic neuropathy, unspecified: Secondary | ICD-10-CM | POA: Diagnosis not present

## 2015-12-11 DIAGNOSIS — H16223 Keratoconjunctivitis sicca, not specified as Sjogren's, bilateral: Secondary | ICD-10-CM | POA: Diagnosis not present

## 2015-12-11 DIAGNOSIS — E1165 Type 2 diabetes mellitus with hyperglycemia: Secondary | ICD-10-CM | POA: Diagnosis not present

## 2016-01-07 DIAGNOSIS — E039 Hypothyroidism, unspecified: Secondary | ICD-10-CM | POA: Diagnosis not present

## 2016-01-07 DIAGNOSIS — H9203 Otalgia, bilateral: Secondary | ICD-10-CM | POA: Diagnosis not present

## 2016-01-07 DIAGNOSIS — E1140 Type 2 diabetes mellitus with diabetic neuropathy, unspecified: Secondary | ICD-10-CM | POA: Diagnosis not present

## 2016-01-15 DIAGNOSIS — I10 Essential (primary) hypertension: Secondary | ICD-10-CM | POA: Diagnosis not present

## 2016-01-15 DIAGNOSIS — Z136 Encounter for screening for cardiovascular disorders: Secondary | ICD-10-CM | POA: Diagnosis not present

## 2016-01-15 DIAGNOSIS — R0602 Shortness of breath: Secondary | ICD-10-CM | POA: Diagnosis not present

## 2016-01-15 DIAGNOSIS — E118 Type 2 diabetes mellitus with unspecified complications: Secondary | ICD-10-CM | POA: Diagnosis not present

## 2016-02-05 DIAGNOSIS — R0989 Other specified symptoms and signs involving the circulatory and respiratory systems: Secondary | ICD-10-CM | POA: Diagnosis not present

## 2016-02-05 DIAGNOSIS — R0602 Shortness of breath: Secondary | ICD-10-CM | POA: Diagnosis not present

## 2016-02-05 DIAGNOSIS — I739 Peripheral vascular disease, unspecified: Secondary | ICD-10-CM | POA: Diagnosis not present

## 2016-02-05 DIAGNOSIS — I10 Essential (primary) hypertension: Secondary | ICD-10-CM | POA: Diagnosis not present

## 2016-02-05 DIAGNOSIS — R252 Cramp and spasm: Secondary | ICD-10-CM | POA: Diagnosis not present

## 2016-02-12 DIAGNOSIS — I10 Essential (primary) hypertension: Secondary | ICD-10-CM | POA: Diagnosis not present

## 2016-02-12 DIAGNOSIS — Z136 Encounter for screening for cardiovascular disorders: Secondary | ICD-10-CM | POA: Diagnosis not present

## 2016-02-12 DIAGNOSIS — E118 Type 2 diabetes mellitus with unspecified complications: Secondary | ICD-10-CM | POA: Diagnosis not present

## 2016-02-12 DIAGNOSIS — E1165 Type 2 diabetes mellitus with hyperglycemia: Secondary | ICD-10-CM | POA: Diagnosis not present

## 2016-02-25 DIAGNOSIS — E039 Hypothyroidism, unspecified: Secondary | ICD-10-CM | POA: Diagnosis not present

## 2016-02-25 DIAGNOSIS — E1140 Type 2 diabetes mellitus with diabetic neuropathy, unspecified: Secondary | ICD-10-CM | POA: Diagnosis not present

## 2016-02-25 DIAGNOSIS — Z6839 Body mass index (BMI) 39.0-39.9, adult: Secondary | ICD-10-CM | POA: Diagnosis not present

## 2016-03-04 DIAGNOSIS — I10 Essential (primary) hypertension: Secondary | ICD-10-CM | POA: Diagnosis not present

## 2016-03-04 DIAGNOSIS — R0602 Shortness of breath: Secondary | ICD-10-CM | POA: Diagnosis not present

## 2016-03-04 DIAGNOSIS — E119 Type 2 diabetes mellitus without complications: Secondary | ICD-10-CM | POA: Diagnosis not present

## 2016-03-11 DIAGNOSIS — R0602 Shortness of breath: Secondary | ICD-10-CM | POA: Diagnosis not present

## 2016-03-11 DIAGNOSIS — E118 Type 2 diabetes mellitus with unspecified complications: Secondary | ICD-10-CM | POA: Diagnosis not present

## 2016-03-11 DIAGNOSIS — R9439 Abnormal result of other cardiovascular function study: Secondary | ICD-10-CM | POA: Diagnosis not present

## 2016-03-11 DIAGNOSIS — E1165 Type 2 diabetes mellitus with hyperglycemia: Secondary | ICD-10-CM | POA: Diagnosis not present

## 2016-05-21 MED FILL — ZOLPIDEM TARTRATE 10 MG TAB: 10 | 30 days supply | Qty: 30 | Fill #0

## 2016-05-26 DIAGNOSIS — E1140 Type 2 diabetes mellitus with diabetic neuropathy, unspecified: Secondary | ICD-10-CM | POA: Diagnosis not present

## 2016-05-26 DIAGNOSIS — R1011 Right upper quadrant pain: Secondary | ICD-10-CM | POA: Diagnosis not present

## 2016-05-26 DIAGNOSIS — E039 Hypothyroidism, unspecified: Secondary | ICD-10-CM | POA: Diagnosis not present

## 2016-05-26 DIAGNOSIS — M549 Dorsalgia, unspecified: Secondary | ICD-10-CM | POA: Diagnosis not present

## 2016-06-25 MED FILL — SYNTHROID 200 MCG TABLET: 200 | 15 days supply | Qty: 13 | Fill #0

## 2016-07-06 DIAGNOSIS — E1165 Type 2 diabetes mellitus with hyperglycemia: Secondary | ICD-10-CM | POA: Diagnosis not present

## 2016-07-06 DIAGNOSIS — E118 Type 2 diabetes mellitus with unspecified complications: Secondary | ICD-10-CM | POA: Diagnosis not present

## 2016-07-06 DIAGNOSIS — Z794 Long term (current) use of insulin: Secondary | ICD-10-CM | POA: Diagnosis not present

## 2016-07-06 DIAGNOSIS — R9439 Abnormal result of other cardiovascular function study: Secondary | ICD-10-CM | POA: Diagnosis not present

## 2016-07-14 MED FILL — SYNTHROID 200 MCG TABLET: 200 | 30 days supply | Qty: 26 | Fill #1

## 2016-07-16 MED FILL — LISINOPRIL 40 MG TABLET: 40 | 90 days supply | Qty: 90 | Fill #0

## 2016-07-20 MED FILL — FUROSEMIDE 20 MG TABLET: 20 | 90 days supply | Qty: 90 | Fill #0

## 2016-07-27 DIAGNOSIS — I10 Essential (primary) hypertension: Secondary | ICD-10-CM | POA: Diagnosis not present

## 2016-07-27 DIAGNOSIS — Z6839 Body mass index (BMI) 39.0-39.9, adult: Secondary | ICD-10-CM | POA: Diagnosis not present

## 2016-07-27 DIAGNOSIS — E1140 Type 2 diabetes mellitus with diabetic neuropathy, unspecified: Secondary | ICD-10-CM | POA: Diagnosis not present

## 2016-07-27 DIAGNOSIS — Z79899 Other long term (current) drug therapy: Secondary | ICD-10-CM | POA: Diagnosis not present

## 2016-07-27 DIAGNOSIS — E039 Hypothyroidism, unspecified: Secondary | ICD-10-CM | POA: Diagnosis not present

## 2016-07-27 MED FILL — ACCU-CHEK GUIDE TEST STRIP: 50 days supply | Qty: 100 | Fill #0

## 2016-07-27 MED FILL — ACCU-CHEK FASTCLIX LANCETS: 50 days supply | Qty: 100 | Fill #0

## 2016-07-27 MED FILL — NYSTATIN 100,000 UNIT/GM PO: 100000 | 20 days supply | Qty: 60 | Fill #0

## 2016-07-29 MED FILL — FLUCONAZOLE 150 MG TABLET: 150 | 1 days supply | Qty: 1 | Fill #0

## 2016-08-09 MED FILL — LANTUS SOLOSTAR 100 UNITS/M: 100 | 38 days supply | Qty: 15 | Fill #0

## 2016-08-17 MED FILL — SYNTHROID 200 MCG TABLET: 200 | 24 days supply | Qty: 21 | Fill #2

## 2016-08-17 MED FILL — CARVEDILOL 3.125 MG TABLET: 3.125 | 30 days supply | Qty: 60 | Fill #0

## 2016-08-17 MED FILL — OMEPRAZOLE DR 40 MG CAPSULE: 40 | 30 days supply | Qty: 30 | Fill #0

## 2016-09-07 MED FILL — SYNTHROID 200 MCG TABLET: 200 | 34 days supply | Qty: 30 | Fill #0

## 2016-09-20 MED FILL — OMEPRAZOLE DR 40 MG CAPSULE: 40 | 30 days supply | Qty: 30 | Fill #1

## 2016-09-28 IMAGING — MG 2D DIGITAL DIAGNOSTIC BILATERAL MAMMOGRAM WITH CAD AND ADJUNCT T
8 of 17 series · 8 of 37 positions shown · non-contrast
Comparison: 07/26/2013, 09/18/2012.

CLINICAL DATA: The patient states that she recently developed an
infection within the medial left breast near the inframammary fold
which she states was associated with sepsis and required
hospitalization. She has noted drainage from this area.

EXAM:
2D DIGITAL DIAGNOSTIC BILATERAL MAMMOGRAM WITH CAD AND ADJUNCT TOMO
ULTRASOUND LEFT BREAST

[R MLO (1 of 2)]
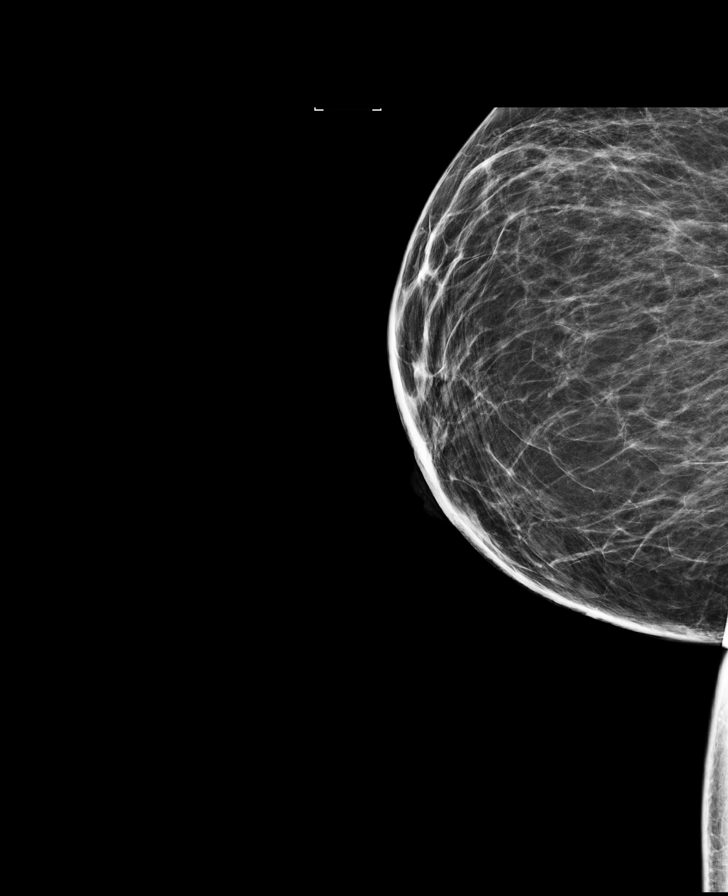

[L MLO]
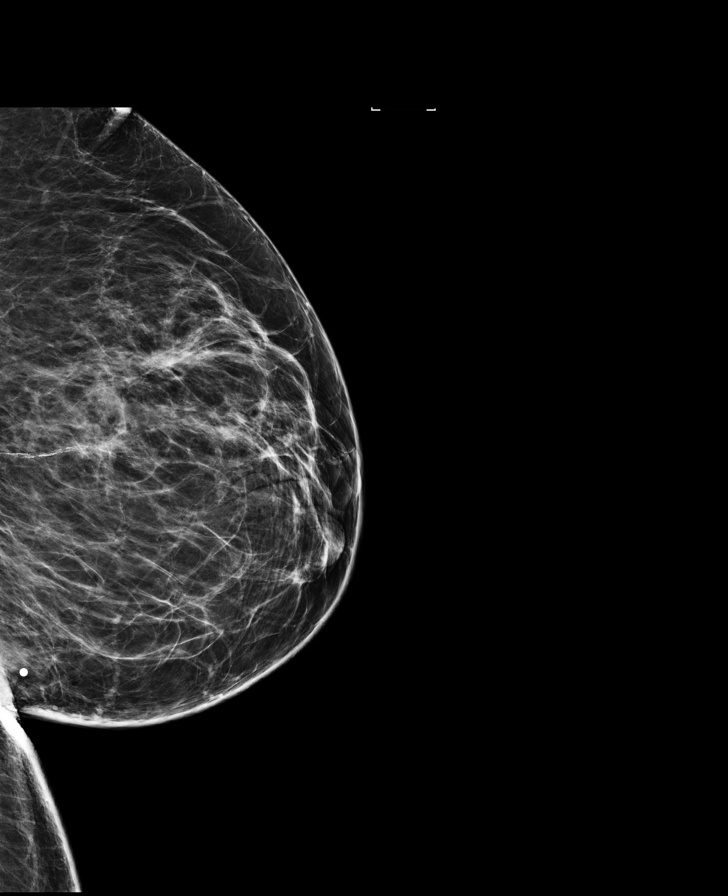

[R CC synth-2D]
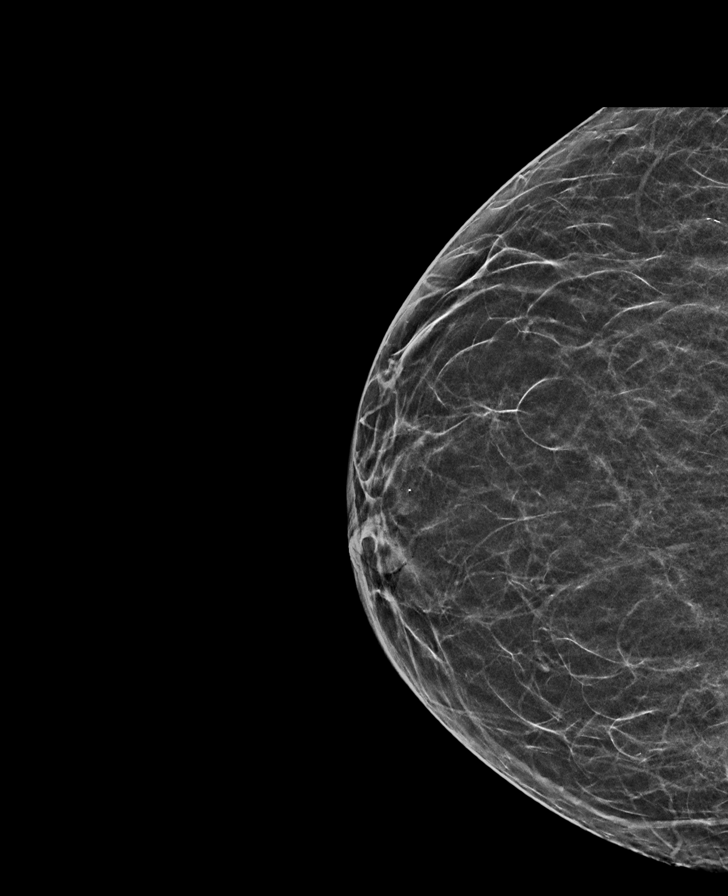

[R MLO synth-2D]
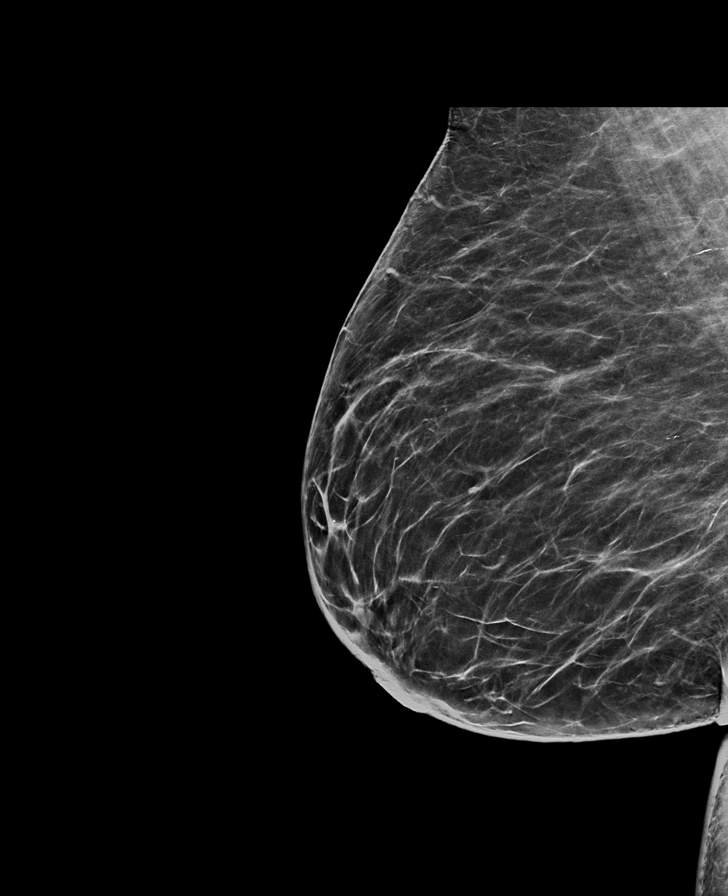

[L MLO synth-2D]
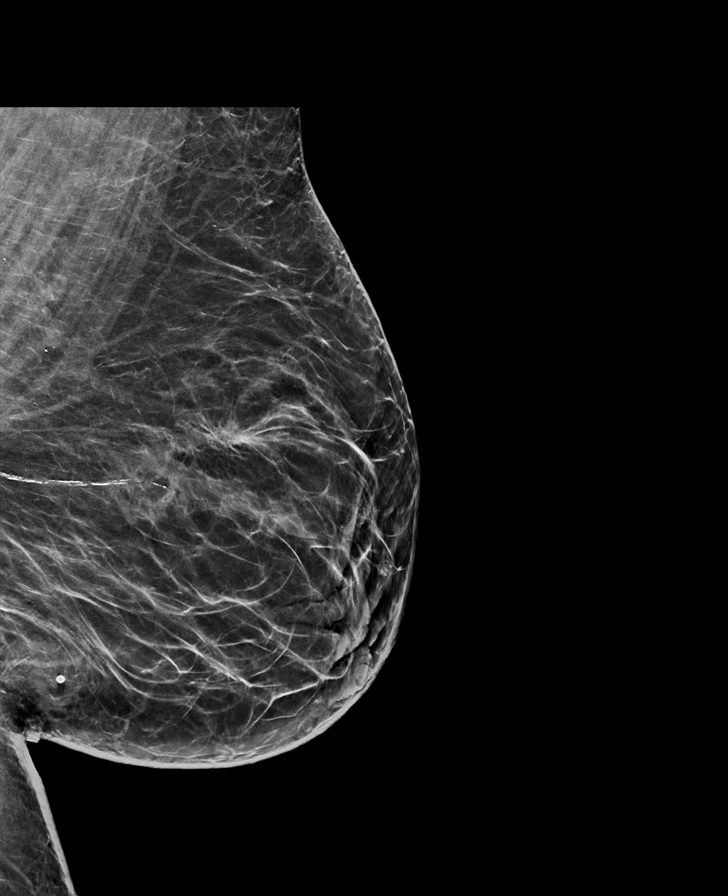

[R MLO (2 of 2)]
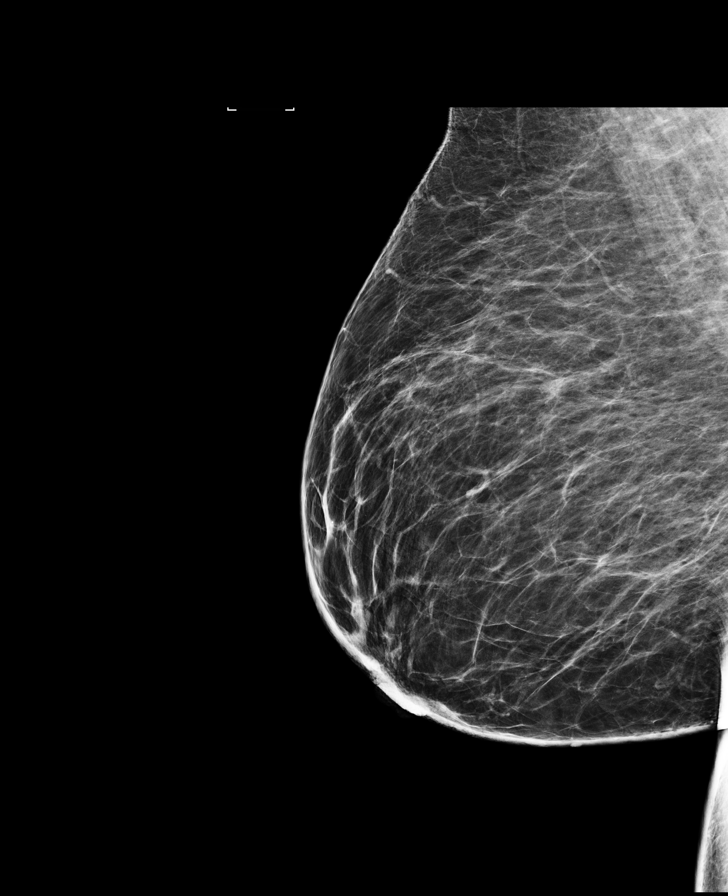

[L CC]
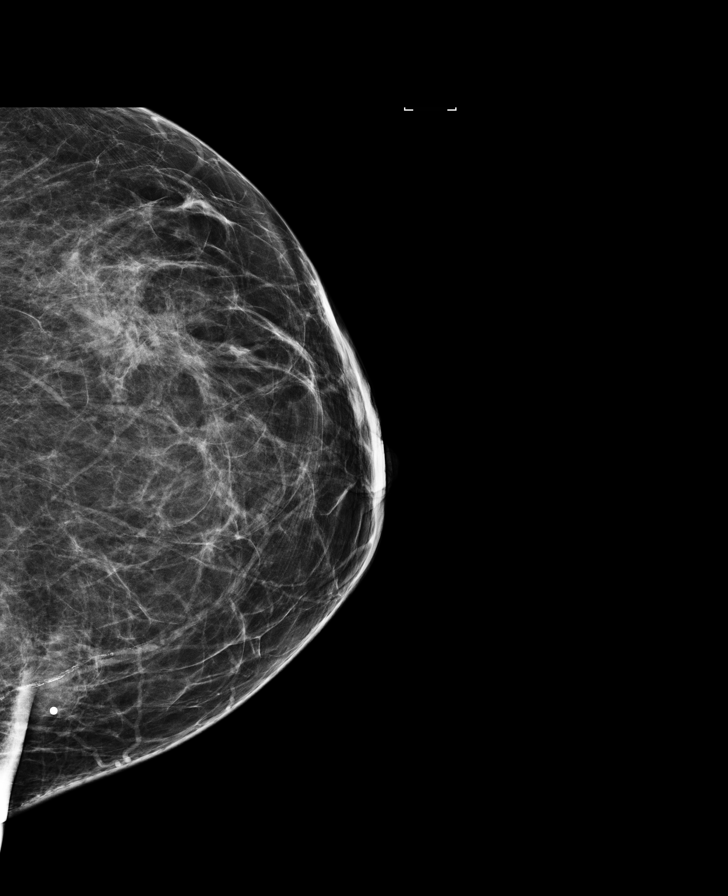

[L TAN synth-2D]
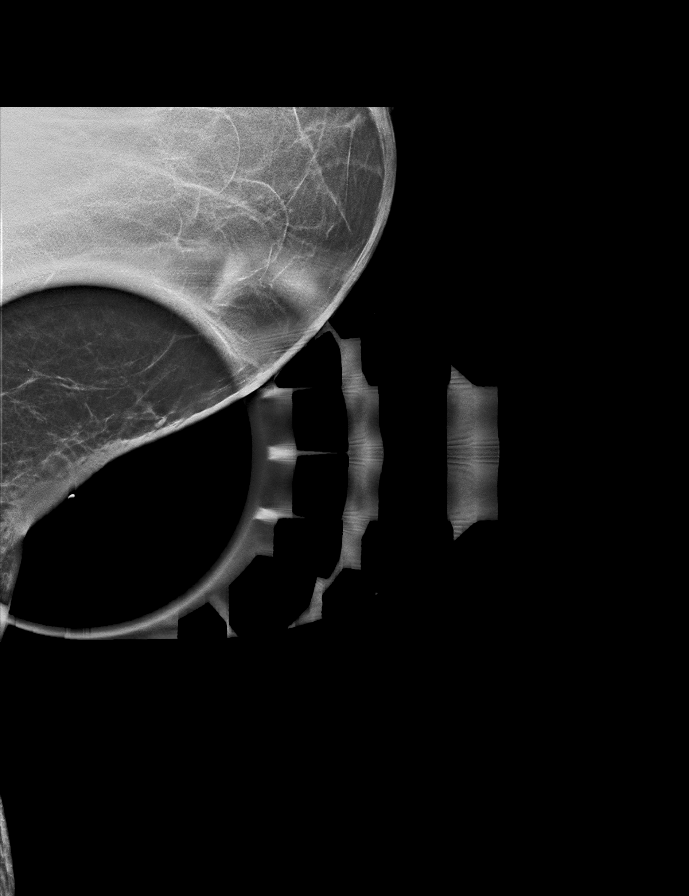

[8 of 37 positions shown; findings below may reference images not displayed]

ACR Breast Density Category b: There are scattered areas of
fibroglandular density.
FINDINGS: There is is a vague area of focal increased density near the left
inframammary fold medially related to the recent
inflammatory/infectious process. There is stable scarring located
laterally within the left breast related to the to a previous benign
excisional biopsy. The right breast parenchymal pattern is stable.

Mammographic images were processed with CAD.

On physical exam, there is a dermal/ subdermal skin lesion located
within the medial left inframammary fold region at the 8 o'clock
position 15 cm from the nipple most consistent with an inflamed
sebaceous cyst or furuncle.

Targeted ultrasound is performed, showing a dermal/subdermal
hypoechoic lesion consistent with an inflamed/infected sebaceous
cyst located within the left breast at 8 o'clock position 15 cm from
the nipple with a sinus tract toward the skin. This measures 3.2 x
1.9 x 0.8 cm in size. Surgical consultation for possible excision is
recommended. There are no additional findings.
IMPRESSION: 3.2 cm probable infected sebaceous cyst or furuncle located within
the left breast at the 8 o'clock position 15 cm from the nipple.
Surgical consultation for possible excision/drainage is recommended.

RECOMMENDATION:
Surgical consultation as discussed above.

I have discussed the findings and recommendations with the patient.
Results were also provided in writing at the conclusion of the
visit. If applicable, a reminder letter will be sent to the patient
regarding the next appointment.

BI-RADS CATEGORY  2: Benign.

## 2016-09-28 IMAGING — US ULTRASOUND LEFT BREAST LIMITED
1 series · 12 of 12 positions shown · non-contrast
Comparison: 07/26/2013, 09/18/2012.

CLINICAL DATA: The patient states that she recently developed an
infection within the medial left breast near the inframammary fold
which she states was associated with sepsis and required
hospitalization. She has noted drainage from this area.

EXAM:
2D DIGITAL DIAGNOSTIC BILATERAL MAMMOGRAM WITH CAD AND ADJUNCT TOMO
ULTRASOUND LEFT BREAST

[Series 1: ultrasound left breast limited · 0.07mm/px · 12 of 12 slices shown]
[im 1/12]
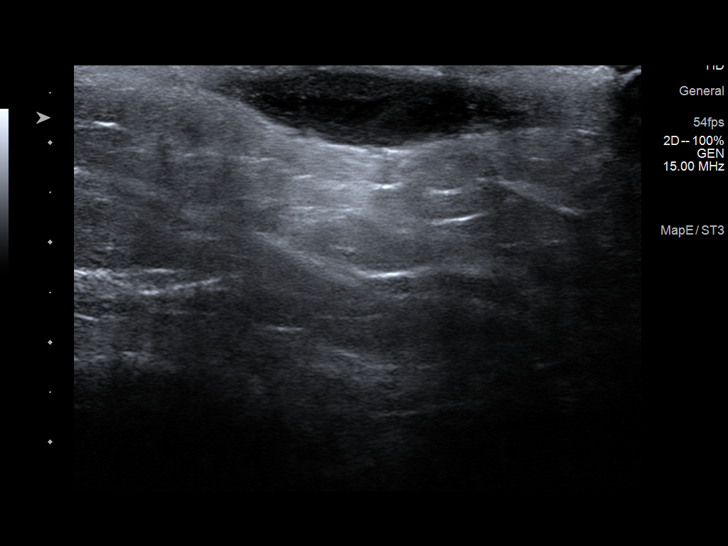
[im 2/12]
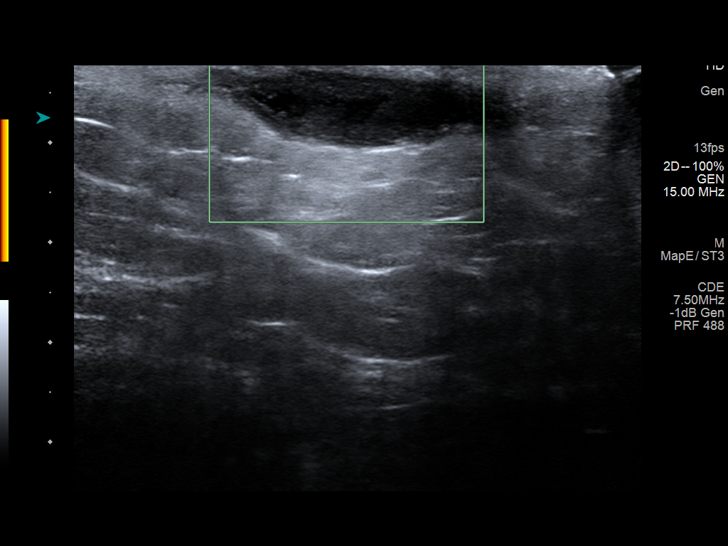
[im 3/12]
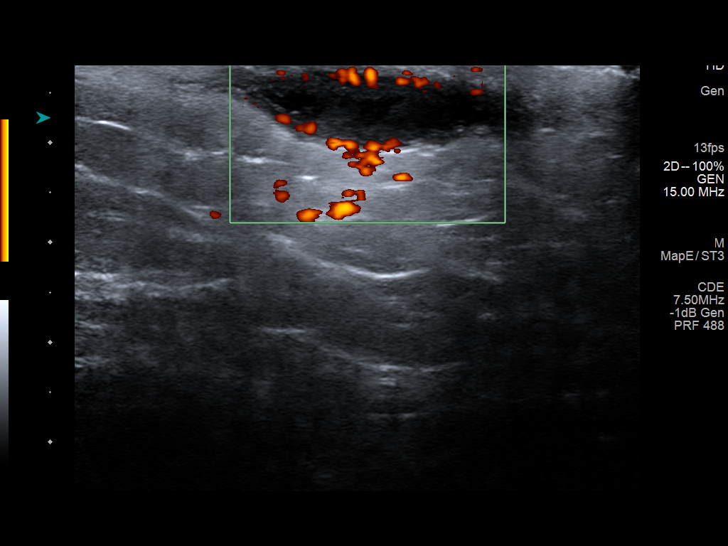
[im 4/12]
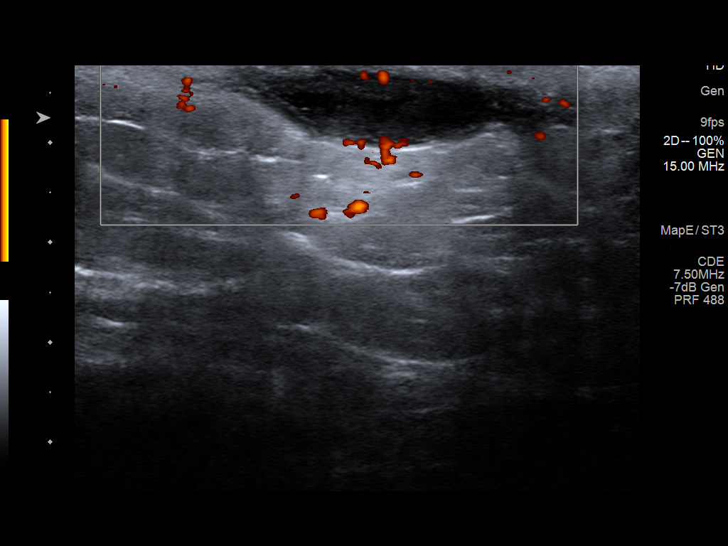
[im 5/12]
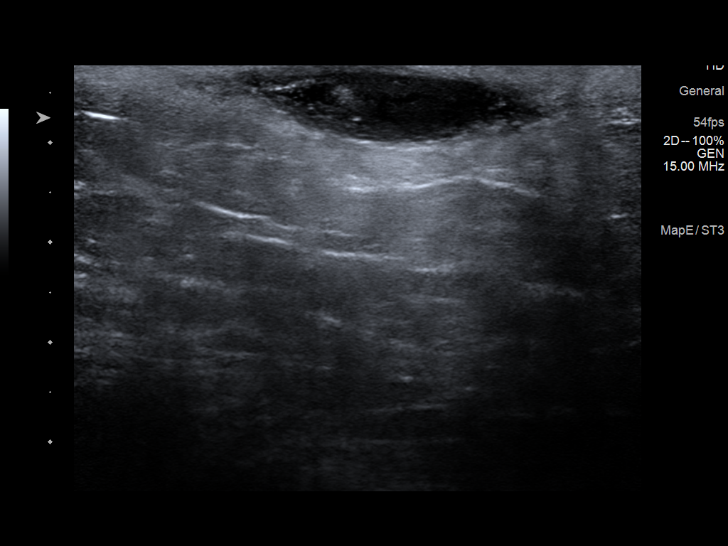
[im 6/12]
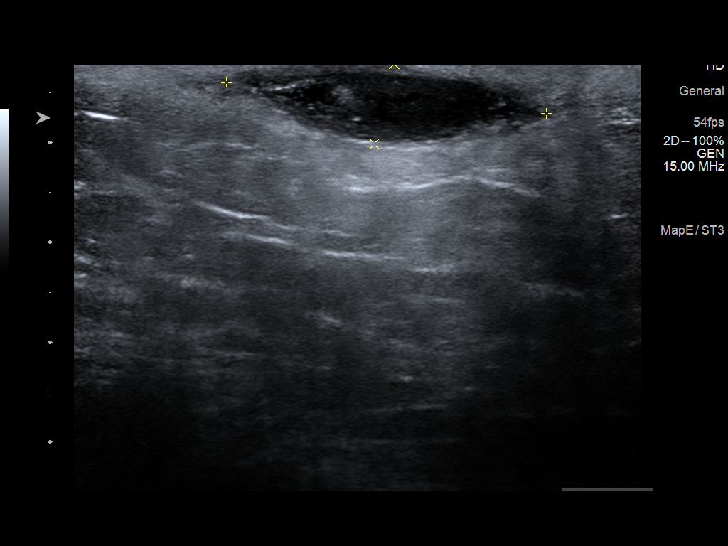
[im 7/12]
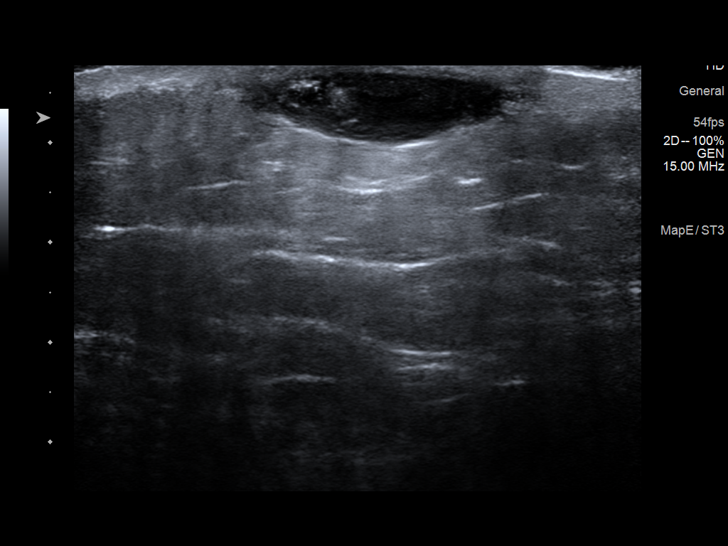
[im 8/12]
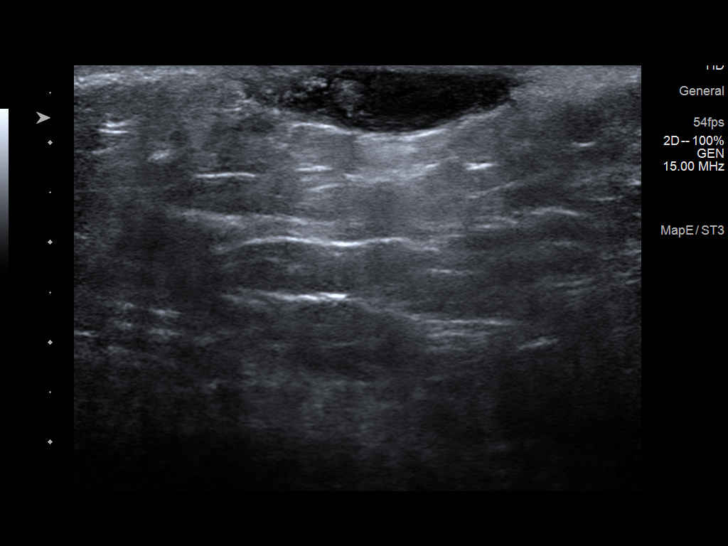
[im 9/12]
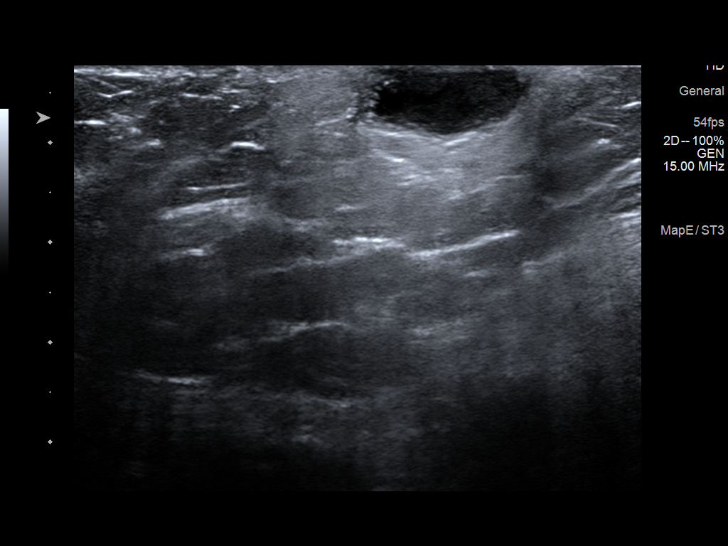
[im 10/12]
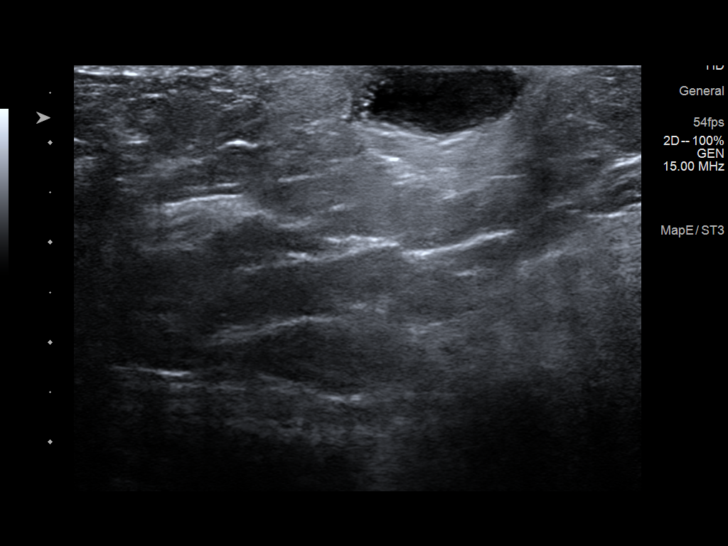
[im 11/12]
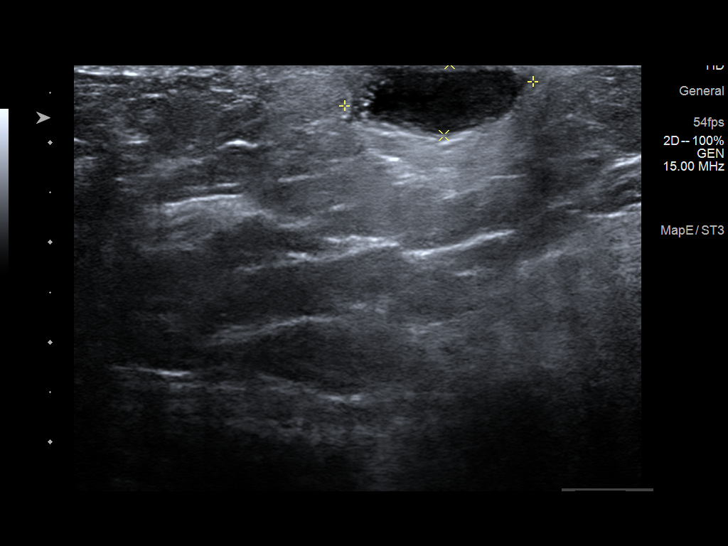
[im 12/12]
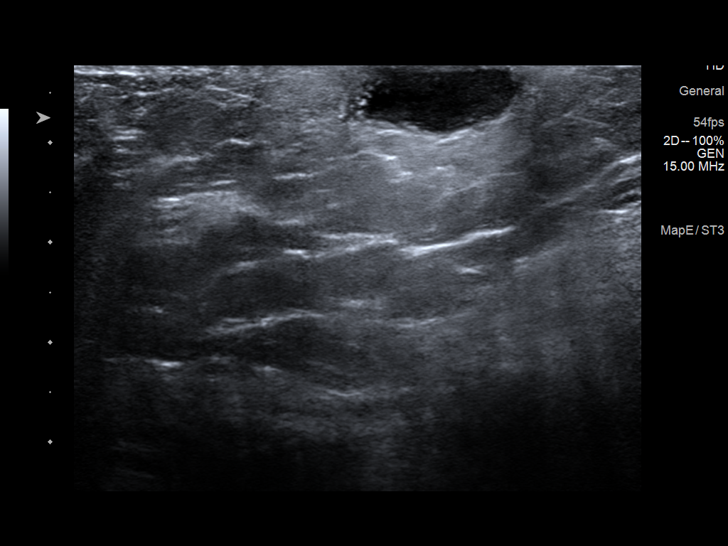

[12 of 12 positions shown; findings below may reference images not displayed]

ACR Breast Density Category b: There are scattered areas of
fibroglandular density.
FINDINGS: There is is a vague area of focal increased density near the left
inframammary fold medially related to the recent
inflammatory/infectious process. There is stable scarring located
laterally within the left breast related to the to a previous benign
excisional biopsy. The right breast parenchymal pattern is stable.

Mammographic images were processed with CAD.

On physical exam, there is a dermal/ subdermal skin lesion located
within the medial left inframammary fold region at the 8 o'clock
position 15 cm from the nipple most consistent with an inflamed
sebaceous cyst or furuncle.

Targeted ultrasound is performed, showing a dermal/subdermal
hypoechoic lesion consistent with an inflamed/infected sebaceous
cyst located within the left breast at 8 o'clock position 15 cm from
the nipple with a sinus tract toward the skin. This measures 3.2 x
1.9 x 0.8 cm in size. Surgical consultation for possible excision is
recommended. There are no additional findings.
IMPRESSION: 3.2 cm probable infected sebaceous cyst or furuncle located within
the left breast at the 8 o'clock position 15 cm from the nipple.
Surgical consultation for possible excision/drainage is recommended.

RECOMMENDATION:
Surgical consultation as discussed above.

I have discussed the findings and recommendations with the patient.
Results were also provided in writing at the conclusion of the
visit. If applicable, a reminder letter will be sent to the patient
regarding the next appointment.

BI-RADS CATEGORY  2: Benign.

## 2016-10-06 DIAGNOSIS — H25811 Combined forms of age-related cataract, right eye: Secondary | ICD-10-CM | POA: Diagnosis not present

## 2016-10-06 DIAGNOSIS — H25813 Combined forms of age-related cataract, bilateral: Secondary | ICD-10-CM | POA: Diagnosis not present

## 2016-10-06 DIAGNOSIS — H25812 Combined forms of age-related cataract, left eye: Secondary | ICD-10-CM | POA: Diagnosis not present

## 2016-10-06 DIAGNOSIS — E113393 Type 2 diabetes mellitus with moderate nonproliferative diabetic retinopathy without macular edema, bilateral: Secondary | ICD-10-CM | POA: Diagnosis not present

## 2016-10-07 DIAGNOSIS — E118 Type 2 diabetes mellitus with unspecified complications: Secondary | ICD-10-CM | POA: Diagnosis not present

## 2016-10-07 DIAGNOSIS — R9439 Abnormal result of other cardiovascular function study: Secondary | ICD-10-CM | POA: Diagnosis not present

## 2016-10-07 DIAGNOSIS — E1165 Type 2 diabetes mellitus with hyperglycemia: Secondary | ICD-10-CM | POA: Diagnosis not present

## 2016-10-07 DIAGNOSIS — R0602 Shortness of breath: Secondary | ICD-10-CM | POA: Diagnosis not present

## 2016-10-07 MED FILL — CARVEDILOL 6.25 MG TABLET: 6.25 | 90 days supply | Qty: 180 | Fill #0

## 2016-10-07 MED FILL — LOSARTAN-HCTZ 100-25 MG TAB: 100-25 | 90 days supply | Qty: 90 | Fill #0

## 2016-10-08 MED FILL — ROSUVASTATIN CALCIUM 20 MG: 20 | 90 days supply | Qty: 90 | Fill #0

## 2016-10-08 MED FILL — BESIVANCE 0.6% SUSP: 0.6 | 30 days supply | Qty: 5 | Fill #0

## 2016-10-08 MED FILL — DUREZOL 0.05% EYE DROPS: 0.05 | 30 days supply | Qty: 5 | Fill #0

## 2016-10-11 MED FILL — OFLOXACIN 0.3% EYE DROPS: 0.3 | 25 days supply | Qty: 5 | Fill #0

## 2016-10-11 MED FILL — PREDNISOLONE AC 1% EYE DROP: 1 | 25 days supply | Qty: 5 | Fill #0

## 2016-10-12 MED FILL — ACCU-CHEK GUIDE TEST STRIP: 50 days supply | Qty: 100 | Fill #1

## 2016-10-14 DIAGNOSIS — H2512 Age-related nuclear cataract, left eye: Secondary | ICD-10-CM | POA: Diagnosis not present

## 2016-10-14 DIAGNOSIS — H25812 Combined forms of age-related cataract, left eye: Secondary | ICD-10-CM | POA: Diagnosis not present

## 2016-10-15 MED FILL — SYNTHROID 200 MCG TABLET: 200 | 34 days supply | Qty: 30 | Fill #1

## 2016-10-15 MED FILL — OMEPRAZOLE DR 40 MG CAPSULE: 40 | 30 days supply | Qty: 30 | Fill #2

## 2016-10-27 MED FILL — FUROSEMIDE 20 MG TABLET: 20 | 90 days supply | Qty: 90 | Fill #0

## 2016-10-28 MED FILL — metFORMIN HCL 1000 MG TABS: 1000 | 90 days supply | Qty: 180 | Fill #0

## 2016-10-28 MED FILL — POTASSIUM CL 10 MEQ TAB SA: 10 | 90 days supply | Qty: 90 | Fill #0

## 2016-10-28 MED FILL — GABAPENTIN 300 MG CAPS: 300 | 45 days supply | Qty: 180 | Fill #0

## 2016-11-03 DIAGNOSIS — R0602 Shortness of breath: Secondary | ICD-10-CM | POA: Diagnosis not present

## 2016-11-09 MED FILL — PREDNISOLONE AC 1% EYE DROP: 1 | 21 days supply | Qty: 5 | Fill #0

## 2016-11-09 MED FILL — OFLOXACIN 0.3% EYE DROPS: 0.3 | 12 days supply | Qty: 5 | Fill #0

## 2016-11-10 DIAGNOSIS — E1165 Type 2 diabetes mellitus with hyperglycemia: Secondary | ICD-10-CM | POA: Diagnosis not present

## 2016-11-10 DIAGNOSIS — R9439 Abnormal result of other cardiovascular function study: Secondary | ICD-10-CM | POA: Diagnosis not present

## 2016-11-10 DIAGNOSIS — E118 Type 2 diabetes mellitus with unspecified complications: Secondary | ICD-10-CM | POA: Diagnosis not present

## 2016-11-10 DIAGNOSIS — R0602 Shortness of breath: Secondary | ICD-10-CM | POA: Diagnosis not present

## 2016-11-11 DIAGNOSIS — H2511 Age-related nuclear cataract, right eye: Secondary | ICD-10-CM | POA: Diagnosis not present

## 2016-11-11 DIAGNOSIS — H25811 Combined forms of age-related cataract, right eye: Secondary | ICD-10-CM | POA: Diagnosis not present

## 2016-11-18 MED FILL — OMEPRAZOLE DR 40 MG CAPSULE: 40 | 30 days supply | Qty: 30 | Fill #0

## 2016-11-18 MED FILL — SYNTHROID 200 MCG TABLET: 200 | 90 days supply | Qty: 90 | Fill #0

## 2016-11-23 DIAGNOSIS — E559 Vitamin D deficiency, unspecified: Secondary | ICD-10-CM | POA: Diagnosis not present

## 2016-11-23 DIAGNOSIS — Z0000 Encounter for general adult medical examination without abnormal findings: Secondary | ICD-10-CM | POA: Diagnosis not present

## 2016-11-23 DIAGNOSIS — I10 Essential (primary) hypertension: Secondary | ICD-10-CM | POA: Diagnosis not present

## 2016-11-23 DIAGNOSIS — E1140 Type 2 diabetes mellitus with diabetic neuropathy, unspecified: Secondary | ICD-10-CM | POA: Diagnosis not present

## 2016-11-23 DIAGNOSIS — E039 Hypothyroidism, unspecified: Secondary | ICD-10-CM | POA: Diagnosis not present

## 2016-11-23 MED FILL — ZOLPIDEM TARTRATE 10 MG TAB: 10 | 30 days supply | Qty: 30 | Fill #0

## 2016-11-24 MED FILL — TRESIBA FLEXTOUCH 200 UNITS: 200 | 41 days supply | Qty: 9 | Fill #0

## 2016-12-21 MED FILL — OMEPRAZOLE DR 40 MG CAPSULE: 40 | 30 days supply | Qty: 30 | Fill #1

## 2016-12-27 ENCOUNTER — Other Ambulatory Visit: Payer: Self-pay | Admitting: Pharmacist

## 2016-12-27 NOTE — Patient Outreach (Signed)
Outreach call to Sarah Gallegos to discuss medication adherence to patient's carvedilol for [facility] quality metric. Left a HIPAA compliant message on the patient's voicemail. If have not heard from patient by next week, will give her another call at that time.   Harlow Asa, PharmD, [facility] [phone]

## 2016-12-31 ENCOUNTER — Other Ambulatory Visit: Payer: Self-pay | Admitting: Pharmacist

## 2016-12-31 NOTE — Patient Outreach (Signed)
Outreach call to [name] to discuss medication adherence to patient's carvedilol for [facility] quality metric. Outreach attempt #2. Left a HIPAA compliant message on the patient's voicemail. If have not heard from patient by next week, will give her another call at that time.   Harlow Asa, PharmD, [facility] [phone]

## 2017-01-03 ENCOUNTER — Other Ambulatory Visit: Payer: Self-pay | Admitting: Pharmacist

## 2017-01-03 MED FILL — TRESIBA FLEXTOUCH 200 UNITS: 200 | 41 days supply | Qty: 9 | Fill #1

## 2017-01-03 MED FILL — LOSARTAN-HCTZ 100-25 MG TAB: 100-25 | 90 days supply | Qty: 90 | Fill #1

## 2017-01-03 NOTE — Patient Outreach (Signed)
Outreach call to Sarah Gallegos to discuss medication adherence to patient's carvedilol for [facility] quality metric. Speak with patient. Patient declines to verify identity and hangs up the call.  Harlow Asa, PharmD, [facility] [phone]

## 2017-01-10 MED FILL — GABAPENTIN 300 MG CAPSULE: 300 | 45 days supply | Qty: 180 | Fill #1

## 2017-01-10 MED FILL — ROSUVASTATIN CALCIUM 20 MG: 20 | 90 days supply | Qty: 90 | Fill #1

## 2017-01-11 MED FILL — metFORMIN HCL 1000 MG TABS: 1000 | 90 days supply | Qty: 180 | Fill #1

## 2017-01-20 DIAGNOSIS — I10 Essential (primary) hypertension: Secondary | ICD-10-CM | POA: Diagnosis not present

## 2017-01-20 DIAGNOSIS — Z79899 Other long term (current) drug therapy: Secondary | ICD-10-CM | POA: Diagnosis not present

## 2017-01-20 DIAGNOSIS — Z6841 Body Mass Index (BMI) 40.0 and over, adult: Secondary | ICD-10-CM | POA: Diagnosis not present

## 2017-01-20 DIAGNOSIS — E1140 Type 2 diabetes mellitus with diabetic neuropathy, unspecified: Secondary | ICD-10-CM | POA: Diagnosis not present

## 2017-01-20 MED FILL — OMEPRAZOLE DR 40 MG CAPSULE: 40 | 30 days supply | Qty: 30 | Fill #0

## 2017-01-24 MED FILL — CARVEDILOL 6.25 MG TABLET: 6.25 | 90 days supply | Qty: 180 | Fill #1

## 2017-01-24 MED FILL — POTASSIUM CL 10 MEQ TAB SA: 10 | 90 days supply | Qty: 90 | Fill #0

## 2017-01-24 MED FILL — FUROSEMIDE 20 MG TABLET: 20 | 90 days supply | Qty: 90 | Fill #1

## 2017-01-31 ENCOUNTER — Other Ambulatory Visit: Payer: Self-pay | Admitting: Internal Medicine

## 2017-01-31 DIAGNOSIS — Z1231 Encounter for screening mammogram for malignant neoplasm of breast: Secondary | ICD-10-CM

## 2017-02-02 DIAGNOSIS — R0602 Shortness of breath: Secondary | ICD-10-CM | POA: Diagnosis not present

## 2017-02-02 DIAGNOSIS — E118 Type 2 diabetes mellitus with unspecified complications: Secondary | ICD-10-CM | POA: Diagnosis not present

## 2017-02-02 DIAGNOSIS — I10 Essential (primary) hypertension: Secondary | ICD-10-CM | POA: Diagnosis not present

## 2017-02-02 DIAGNOSIS — R9439 Abnormal result of other cardiovascular function study: Secondary | ICD-10-CM | POA: Diagnosis not present

## 2017-02-10 MED FILL — TRESIBA FLEXTOUCH 200 UNITS: 200 | 41 days supply | Qty: 9 | Fill #2

## 2017-02-24 MED FILL — OMEPRAZOLE DR 40 MG CAPSULE: 40 | 30 days supply | Qty: 30 | Fill #1

## 2017-02-25 DIAGNOSIS — Z7982 Long term (current) use of aspirin: Secondary | ICD-10-CM | POA: Diagnosis not present

## 2017-02-25 DIAGNOSIS — E1140 Type 2 diabetes mellitus with diabetic neuropathy, unspecified: Secondary | ICD-10-CM | POA: Diagnosis not present

## 2017-02-25 DIAGNOSIS — R928 Other abnormal and inconclusive findings on diagnostic imaging of breast: Secondary | ICD-10-CM | POA: Diagnosis not present

## 2017-02-25 DIAGNOSIS — I10 Essential (primary) hypertension: Secondary | ICD-10-CM | POA: Diagnosis not present

## 2017-03-04 DIAGNOSIS — R0602 Shortness of breath: Secondary | ICD-10-CM | POA: Diagnosis not present

## 2017-03-04 DIAGNOSIS — R9439 Abnormal result of other cardiovascular function study: Secondary | ICD-10-CM | POA: Diagnosis not present

## 2017-03-04 DIAGNOSIS — E1165 Type 2 diabetes mellitus with hyperglycemia: Secondary | ICD-10-CM | POA: Diagnosis not present

## 2017-03-04 DIAGNOSIS — E118 Type 2 diabetes mellitus with unspecified complications: Secondary | ICD-10-CM | POA: Diagnosis not present

## 2017-03-04 MED FILL — FUROSEMIDE 40 MG TAB: 40 | 60 days supply | Qty: 120 | Fill #0

## 2017-03-04 MED FILL — ACCU-CHEK GUIDE TEST STRIP: 50 days supply | Qty: 100 | Fill #0

## 2017-03-09 ENCOUNTER — Other Ambulatory Visit: Payer: Self-pay | Admitting: Internal Medicine

## 2017-03-09 DIAGNOSIS — N6002 Solitary cyst of left breast: Secondary | ICD-10-CM

## 2017-03-16 MED FILL — SYNTHROID 200 MCG TABLET: 200 | 90 days supply | Qty: 90 | Fill #1

## 2017-03-17 ENCOUNTER — Other Ambulatory Visit: Payer: Medicare PPO

## 2017-03-17 ENCOUNTER — Inpatient Hospital Stay
Admission: RE | Admit: 2017-03-17 | Discharge: 2017-03-17 | Disposition: A | Discharge status: Home / Self Care | Payer: Medicare PPO | Source: Ambulatory Visit | Attending: Internal Medicine | Admitting: Internal Medicine

## 2017-03-21 DIAGNOSIS — E1140 Type 2 diabetes mellitus with diabetic neuropathy, unspecified: Secondary | ICD-10-CM | POA: Diagnosis not present

## 2017-03-21 DIAGNOSIS — Z79899 Other long term (current) drug therapy: Secondary | ICD-10-CM | POA: Diagnosis not present

## 2017-03-21 DIAGNOSIS — G44209 Tension-type headache, unspecified, not intractable: Secondary | ICD-10-CM | POA: Diagnosis not present

## 2017-03-21 DIAGNOSIS — I10 Essential (primary) hypertension: Secondary | ICD-10-CM | POA: Diagnosis not present

## 2017-03-21 MED FILL — CYCLOBENZAPRINE 10 MG TAB: 10 | 30 days supply | Qty: 30 | Fill #0

## 2017-03-24 MED FILL — OZEMPIC 0.25 OR 0.5 MG/DOSE: 2 | 84 days supply | Qty: 5 | Fill #0

## 2017-03-24 MED FILL — OMEPRAZOLE DR 40 MG CAPSULE: 40 | 30 days supply | Qty: 30 | Fill #2

## 2017-03-31 ENCOUNTER — Encounter (HOSPITAL_COMMUNITY): Payer: Self-pay | Admitting: Emergency Medicine

## 2017-03-31 ENCOUNTER — Emergency Department (HOSPITAL_COMMUNITY): Payer: Medicare PPO

## 2017-03-31 ENCOUNTER — Emergency Department (HOSPITAL_COMMUNITY)
Admission: EM | Admit: 2017-03-31 | Discharge: 2017-04-01 | Disposition: A | Discharge status: Home / Self Care | Payer: Medicare PPO | Attending: Emergency Medicine | Admitting: Emergency Medicine

## 2017-03-31 DIAGNOSIS — R739 Hyperglycemia, unspecified: Secondary | ICD-10-CM

## 2017-03-31 DIAGNOSIS — R112 Nausea with vomiting, unspecified: Secondary | ICD-10-CM | POA: Diagnosis not present

## 2017-03-31 DIAGNOSIS — Z7982 Long term (current) use of aspirin: Secondary | ICD-10-CM | POA: Insufficient documentation

## 2017-03-31 DIAGNOSIS — Z87891 Personal history of nicotine dependence: Secondary | ICD-10-CM | POA: Diagnosis not present

## 2017-03-31 DIAGNOSIS — R079 Chest pain, unspecified: Secondary | ICD-10-CM | POA: Diagnosis not present

## 2017-03-31 DIAGNOSIS — Z79899 Other long term (current) drug therapy: Secondary | ICD-10-CM | POA: Diagnosis not present

## 2017-03-31 DIAGNOSIS — R0602 Shortness of breath: Secondary | ICD-10-CM | POA: Diagnosis not present

## 2017-03-31 DIAGNOSIS — E1165 Type 2 diabetes mellitus with hyperglycemia: Secondary | ICD-10-CM | POA: Diagnosis not present

## 2017-03-31 DIAGNOSIS — E039 Hypothyroidism, unspecified: Secondary | ICD-10-CM | POA: Diagnosis not present

## 2017-03-31 DIAGNOSIS — N760 Acute vaginitis: Secondary | ICD-10-CM | POA: Diagnosis not present

## 2017-03-31 DIAGNOSIS — R519 Headache, unspecified: Secondary | ICD-10-CM

## 2017-03-31 DIAGNOSIS — R51 Headache: Secondary | ICD-10-CM | POA: Diagnosis not present

## 2017-03-31 DIAGNOSIS — I10 Essential (primary) hypertension: Secondary | ICD-10-CM | POA: Diagnosis not present

## 2017-03-31 LAB — CBC WITH DIFFERENTIAL/PLATELET
Basophils Absolute: 0 10*3/uL (ref 0.0–0.1)
Basophils Relative: 0 %
Eosinophils Absolute: 0.2 10*3/uL (ref 0.0–0.7)
Eosinophils Relative: 1 %
HCT: 42.5 % (ref 36.0–46.0)
Hemoglobin: 13.7 g/dL (ref 12.0–15.0)
Lymphocytes Relative: 29 %
Lymphs Abs: 3.4 10*3/uL (ref 0.7–4.0)
MCH: 26.9 pg (ref 26.0–34.0)
MCHC: 32.2 g/dL (ref 30.0–36.0)
MCV: 83.5 fL (ref 78.0–100.0)
Monocytes Absolute: 0.7 10*3/uL (ref 0.1–1.0)
Monocytes Relative: 6 %
Neutro Abs: 7.6 10*3/uL (ref 1.7–7.7)
Neutrophils Relative %: 64 %
Platelets: 312 10*3/uL (ref 150–400)
RBC: 5.09 MIL/uL (ref 3.87–5.11)
RDW: 15.1 % (ref 11.5–15.5)
WBC: 11.9 10*3/uL — ABNORMAL HIGH (ref 4.0–10.5)

## 2017-03-31 LAB — BASIC METABOLIC PANEL
Anion gap: 11 (ref 5–15)
BUN: 25 mg/dL — ABNORMAL HIGH (ref 6–20)
CO2: 29 mmol/L (ref 22–32)
Calcium: 10.6 mg/dL — ABNORMAL HIGH (ref 8.9–10.3)
Chloride: 99 mmol/L — ABNORMAL LOW (ref 101–111)
Creatinine, Ser: 1.09 mg/dL — ABNORMAL HIGH (ref 0.44–1.00)
GFR calc Af Amer: >60 mL/min (ref >60)
GFR calc non Af Amer: 56 mL/min — ABNORMAL LOW (ref >60)
Glucose, Bld: 235 mg/dL — ABNORMAL HIGH (ref 65–99)
Potassium: 3.9 mmol/L (ref 3.5–5.1)
Sodium: 139 mmol/L (ref 135–145)

## 2017-03-31 LAB — URINALYSIS, ROUTINE W REFLEX MICROSCOPIC
Bilirubin Urine: NEGATIVE
Glucose, UA: >=500 mg/dL — AB
Hgb urine dipstick: NEGATIVE
Ketones, ur: NEGATIVE mg/dL
NITRITE: NEGATIVE
PH: 5 (ref 5.0–8.0)
PROTEIN: 100 mg/dL — AB
Specific Gravity, Urine: 1.025 (ref 1.005–1.030)

## 2017-03-31 LAB — I-STAT TROPONIN, ED: Troponin i, poc: 0 ng/mL (ref 0.00–0.08)

## 2017-03-31 MED ORDER — CARVEDILOL 12.5 MG PO TABS
6.2500 mg | ORAL_TABLET | Freq: Two times a day (BID) | ORAL | Status: DC
  Administered 2017-04-01: 6.25 mg via ORAL
  Filled 2017-03-31 (×2): qty 1

## 2017-03-31 MED ORDER — FUROSEMIDE 20 MG PO TABS
40.0000 mg | ORAL_TABLET | Freq: Once | ORAL | Status: AC
  Administered 2017-03-31: 40 mg via ORAL
  Filled 2017-03-31: qty 2

## 2017-03-31 MED ORDER — ACETAMINOPHEN 325 MG PO TABS
650.0000 mg | ORAL_TABLET | Freq: Once | ORAL | Status: AC
  Administered 2017-03-31: 650 mg via ORAL
  Filled 2017-03-31: qty 2

## 2017-03-31 MED ORDER — KETOROLAC TROMETHAMINE 30 MG/ML IJ SOLN
30.0000 mg | Freq: Once | INTRAMUSCULAR | Status: AC
  Administered 2017-03-31: 30 mg via INTRAVENOUS
  Filled 2017-03-31: qty 1

## 2017-03-31 MED ORDER — DIPHENHYDRAMINE HCL 25 MG PO CAPS
25.0000 mg | ORAL_CAPSULE | Freq: Once | ORAL | Status: AC
  Administered 2017-03-31: 25 mg via ORAL
  Filled 2017-03-31: qty 1

## 2017-03-31 MED ORDER — METOCLOPRAMIDE HCL 10 MG PO TABS
5.0000 mg | ORAL_TABLET | Freq: Once | ORAL | Status: AC
Start: 2017-03-31 — End: 2017-03-31
  Administered 2017-03-31: 5 mg via ORAL
  Filled 2017-03-31: qty 1

## 2017-03-31 MED ORDER — SODIUM CHLORIDE 0.9 % IV BOLUS (SEPSIS)
1000.0000 mL | Freq: Once | INTRAVENOUS | Status: AC
  Administered 2017-03-31: 1000 mL via INTRAVENOUS

## 2017-03-31 MED ORDER — FLUCONAZOLE 100 MG PO TABS
200.0000 mg | ORAL_TABLET | Freq: Once | ORAL | Status: AC
Start: 2017-04-01 — End: 2017-04-01
  Administered 2017-04-01: 200 mg via ORAL
  Filled 2017-03-31: qty 2

## 2017-03-31 MED ORDER — LOSARTAN POTASSIUM 50 MG PO TABS
100.0000 mg | ORAL_TABLET | Freq: Once | ORAL | Status: AC
  Administered 2017-04-01: 100 mg via ORAL
  Filled 2017-03-31 (×2): qty 2

## 2017-03-31 MED ORDER — HYDROCHLOROTHIAZIDE 25 MG PO TABS
25.0000 mg | ORAL_TABLET | Freq: Every day | ORAL | Status: DC
  Administered 2017-04-01: 25 mg via ORAL
  Filled 2017-03-31: qty 1

## 2017-03-31 NOTE — ED Provider Notes (Signed)
[facility] EMERGENCY DEPARTMENT Provider Note   CSN: [id number] Arrival date & time: 03/31/17  1244     History   Chief Complaint Chief Complaint  Patient presents with  . Headache  . Chest Pain    HPI Sarah Gallegos is a 57 y.o. female with a hx of NIDDM, HTN, high cholesterol, hypothyroid, neuropathy presents to the Emergency Department complaining of gradual, persistent, progressively worsening headache onset 1 week before christmas.  Pt reports her PCP thought this was tension and prescribed muscle relaxers.  Pt reports this helped some, but not a lot.  Her symptoms returned 3 days ago.  Patient reports that today she had some blurred vision.  At triage she was medically screened and given some medications for her headache.  She reports this helped but did not resolve her headache.  She does report that her blurred vision resolved.  She reports some photophobia and phonophobia.  Patient denies history of migraine headaches.  She reports history of neuropathy but denies new numbness, tingling or weakness in her hands feet arms or legs.  No gait disturbance or dysarthria.  Additionally, patient reports chest pain last night with associated nausea and vomiting.  She denies diaphoresis, syncope or near syncope.  She reports sipping on ginger ale but she has not eaten since that time.  She denied abdominal pain, weakness, dizziness, diarrhea.  She denies recent international travel.  She does state that her husband has been ill with similar GI symptoms.  No known aggravating or alleviating factors for this.  Patient also complaining of vaginal itching and discharge.  She reports associated dysuria without urinary urgency, frequency or hematuria.  She reports dry mouth.  She states she is concerned about a possible yeast infection as this feels similar to previous ones.  Patient has a history of abdominal hysterectomy.  She denies concerns for STD.    The history is  provided by the patient and medical records. No language interpreter was used.    Past Medical History:  Diagnosis Date  . Diabetes mellitus without complication ([place])   . Hypercholesteremia   . Hypertension   . Hypothyroidism   . Neuropathy     Patient Active Problem List   Diagnosis Date Noted  . Sepsis associated hypotension ([place]) 10/10/2015  . Cellulitis and abscess 10/10/2015  . Type 2 diabetes mellitus ([place]) 10/10/2015  . Essential hypertension 10/10/2015  . Hyperlipidemia 10/10/2015  . Neuropathy due to type 2 diabetes mellitus ([place]) 10/10/2015  . Hypothyroidism 10/10/2015    Past Surgical History:  Procedure Laterality Date  . ABDOMINAL HYSTERECTOMY     2008  . BACK SURGERY     2003, [facility], [place], [place]  . BREAST BIOPSY     2003, [facility], [place], [place] SURGERY     2010,  for graves disease. [place], [place]     2003, [facility], [place], [place]    [place] History    No data available       Home Medications    Prior to Admission medications   Medication Sig Start Date End Date Taking? Authorizing Provider  aspirin 81 MG chewable tablet Chew 81 mg by mouth daily.   Yes [provider]  carvedilol (COREG) 6.25 MG tablet Take 6.25 mg by mouth 2 (two) times daily with a meal.   Yes [provider]  Cholecalciferol (VITAMIN D-3) 5000 units TABS Take 5,000 Units by mouth  daily.   Yes [provider]  dapagliflozin propanediol (FARXIGA) 10 MG TABS tablet Take 10 mg by mouth daily.   Yes [provider]  furosemide (LASIX) 40 MG tablet Take 40 mg by mouth 2 (two) times daily.   Yes [provider]  gabapentin (NEURONTIN) 300 MG capsule Take 600 mg by mouth 2 (two) times daily.    Yes [provider]  Insulin Degludec (TRESIBA FLEXTOUCH) 200 UNIT/ML SOPN Inject 43 Units into the skin at bedtime.   Yes [provider]  levothyroxine (SYNTHROID,  LEVOTHROID) 200 MCG tablet Take 200 mcg by mouth daily before breakfast.   Yes [provider]  losartan-hydrochlorothiazide (HYZAAR) 100-25 MG tablet Take 1 tablet by mouth daily.   Yes [provider]  MELATONIN PO Take 1 tablet by mouth daily.   Yes [provider]  metFORMIN (GLUCOPHAGE) 1000 MG tablet Take 1,000 mg by mouth 2 (two) times daily with a meal.   Yes [provider]  omeprazole (PRILOSEC) 40 MG capsule Take 40 mg by mouth daily.    Yes [provider]  potassium chloride (K-DUR,KLOR-CON) 10 MEQ tablet Take 10 mEq by mouth 2 (two) times daily.   Yes [provider]  rosuvastatin (CRESTOR) 20 MG tablet Take 20 mg by mouth daily.   Yes [provider]  Semaglutide (OZEMPIC) 0.25 or 0.5 MG/DOSE SOPN Inject 0.5 mg into the skin once a week.   Yes [provider]  simvastatin (ZOCOR) 20 MG tablet Take 20 mg by mouth daily.    Yes [provider]  lisinopril (PRINIVIL) 10 MG tablet Take 1 tablet (10 mg total) by mouth daily. Patient not taking: Reported on 03/31/2017 10/12/15   Bonnell Public, MD    Family History Family History  Problem Relation Age of Onset  . Heart failure Mother   . Stroke Mother   . Hypertension Father     Social History Social History   Tobacco Use  . Smoking status: Former Smoker    Packs/day: 1.00    Years: 7.00    Pack years: 7.00    Types: Cigarettes  . Smokeless tobacco: Never Used  . Tobacco comment: quit in 1987  Substance Use Topics  . Alcohol use: No    Alcohol/week: 0.0 oz  . Drug use: No     Allergies   Lantus [insulin glargine] and Tradjenta [linagliptin]   Review of Systems Review of Systems  Constitutional: Negative for appetite change, diaphoresis, fatigue, fever and unexpected weight change.  HENT: Negative for congestion, ear pain, facial swelling, mouth sores, postnasal drip, rhinorrhea, sinus pain, sneezing and sore throat.   Eyes:  Negative for visual disturbance.  Respiratory: Negative for cough, chest tightness, shortness of breath and wheezing.   Cardiovascular: Positive for chest pain.  Gastrointestinal: Positive for nausea and vomiting. Negative for abdominal pain, constipation and diarrhea.  Endocrine: Positive for polydipsia. Negative for polyphagia and polyuria.       Dry mouth  Genitourinary: Positive for dysuria and vaginal discharge. Negative for frequency, hematuria, pelvic pain, urgency and vaginal bleeding.  Musculoskeletal: Positive for neck pain (left sided). Negative for back pain and neck stiffness.  Skin: Negative for rash.  Allergic/Immunologic: Negative for immunocompromised state.  Neurological: Positive for headaches. Negative for syncope and light-headedness.  Hematological: Does not bruise/bleed easily.  Psychiatric/Behavioral: Negative for sleep disturbance. The patient is not nervous/anxious.      Physical Exam Updated Vital Signs BP (!) 166/101   Pulse  75   Temp 98 F (36.7 C) (Oral)   Resp 13   Ht 5\' 8"  (1.727 m)   Wt 123.8 kg (273 lb)   SpO2 96%   BMI 41.51 kg/m   Physical Exam  Constitutional: She is oriented to person, place, and time. She appears well-developed and well-nourished. No distress.  Awake, alert, nontoxic appearance  HENT:  Head: Normocephalic and atraumatic.  Mouth/Throat: Oropharynx is clear and moist. Mucous membranes are dry. No oropharyngeal exudate, posterior oropharyngeal edema or posterior oropharyngeal erythema.  Eyes: Conjunctivae and EOM are normal. Pupils are equal, round, and reactive to light. No scleral icterus.  No horizontal, vertical or rotational nystagmus  Neck: Normal range of motion. Neck supple.  Full active and passive ROM without pain No midline tenderness Mild left sided paraspinal tenderness No nuchal rigidity or meningeal signs  Cardiovascular: Normal rate, regular rhythm, normal heart sounds and intact distal pulses.  No murmur  heard. Pulmonary/Chest: Effort normal and breath sounds normal. No respiratory distress. She has no wheezes. She has no rales.  Equal chest expansion  Abdominal: Soft. Bowel sounds are normal. She exhibits no mass. There is no tenderness. There is no rebound and no guarding.  Musculoskeletal: Normal range of motion. She exhibits no edema.  Lymphadenopathy:    She has no cervical adenopathy.       Right: No inguinal adenopathy present.       Left: No inguinal adenopathy present.  Neurological: She is alert and oriented to person, place, and time. No cranial nerve deficit. She exhibits normal muscle tone. Coordination normal.  Mental Status:  Alert, oriented, thought content appropriate. Speech fluent without evidence of aphasia. Able to follow 2 step commands without difficulty.  Cranial Nerves:  II:  Peripheral visual fields grossly normal, pupils equal, round, reactive to light III,IV, VI: ptosis not present, extra-ocular motions intact bilaterally  V,VII: smile symmetric, facial light touch sensation equal VIII: hearing grossly normal bilaterally  IX,X: midline uvula rise  XI: bilateral shoulder shrug equal and strong XII: midline tongue extension  Motor:  5/5 in upper and lower extremities bilaterally including strong and equal grip strength and dorsiflexion/plantar flexion Sensory: Pinprick and light touch normal in all extremities.  Cerebellar: normal finger-to-nose with bilateral upper extremities Gait: normal gait and balance CV: distal pulses palpable throughout   Skin: Skin is warm and dry. No rash noted. She is not diaphoretic. No erythema.  Psychiatric: She has a normal mood and affect. Her behavior is normal. Judgment and thought content normal.  Nursing note and vitals reviewed.    ED Treatments / Results  Labs (all labs ordered are listed, but only abnormal results are displayed) Labs Reviewed  CBC WITH DIFFERENTIAL/PLATELET - Abnormal; Notable for the following  components:      Result Value   WBC 11.9 (*)    All other components within normal limits  BASIC METABOLIC PANEL - Abnormal; Notable for the following components:   Chloride 99 (*)    Glucose, Bld 235 (*)    BUN 25 (*)    Creatinine, Ser 1.09 (*)    Calcium 10.6 (*)    GFR calc non Af Amer 56 (*)    All other components within normal limits  URINALYSIS, ROUTINE W REFLEX MICROSCOPIC - Abnormal; Notable for the following components:   APPearance HAZY (*)    Glucose, UA >=500 (*)    Protein, ur 100 (*)    Leukocytes, UA TRACE (*)    Bacteria, UA RARE (*)  Squamous Epithelial / LPF 0-5 (*)    All other components within normal limits  WET PREP, GENITAL  I-STAT TROPONIN, ED  GC/CHLAMYDIA PROBE AMP ([place]) NOT AT [facility]    EKG  EKG Interpretation  Date/Time:  Thursday March 31 2017 15:57:24 EST Ventricular Rate:  77 PR Interval:    QRS Duration: 93 QT Interval:  384 QTC Calculation: 435 R Axis:   -42 Text Interpretation:  Sinus rhythm Abnormal R-wave progression, late transition Left ventricular hypertrophy Baseline wander in lead(s) V1 Confirmed by Quintella Reichert [phone]) on 03/31/2017 8:37:39 PM       Radiology Dg Chest 2 View  Result Date: 03/31/2017 CLINICAL DATA:  Chest pain.  Shortness of breath. EXAM: CHEST  2 VIEW COMPARISON:  October 10, 2015 FINDINGS: The heart size and mediastinal contours are within normal limits. Both lungs are clear. The visualized skeletal structures are unremarkable. IMPRESSION: No active cardiopulmonary disease. Electronically Signed   By: Dorise Bullion III M.D   On: 03/31/2017 16:59   Ct Head Wo Contrast  Result Date: 03/31/2017 CLINICAL DATA:  Left-sided headache, nausea, and light sensitivity for 2 days. EXAM: CT HEAD WITHOUT CONTRAST TECHNIQUE: Contiguous axial images were obtained from the base of the skull through the vertex without intravenous contrast. COMPARISON:  10/10/2015 FINDINGS: Brain: There is no evidence of acute infarct,  intracranial hemorrhage, mass, midline shift, or extra-axial fluid collection. The ventricles and sulci are normal. Incidental calcifications in the globus pallidus and hippocampi bilaterally. Vascular: Calcified atherosclerosis at the skullbase. No hyperdense vessel. Skull: No fracture or focal osseous lesion. Sinuses/Orbits: Paranasal sinuses and mastoid air cells are clear. Bilateral cataract extraction. Chronic bilateral exophthalmos. Other: None. IMPRESSION: No evidence of acute intracranial abnormality. Electronically Signed   By: Logan Bores M.D.   On: 03/31/2017 16:58    Procedures Procedures (including critical care time)  Medications Ordered in ED Medications  carvedilol (COREG) tablet 6.25 mg (6.25 mg Oral Given 04/01/17 0030)  hydrochlorothiazide (HYDRODIURIL) tablet 25 mg (25 mg Oral Given 04/01/17 0031)  acetaminophen (TYLENOL) tablet 650 mg (650 mg Oral Given 03/31/17 1614)  metoCLOPramide (REGLAN) tablet 5 mg (5 mg Oral Given 03/31/17 1614)  diphenhydrAMINE (BENADRYL) capsule 25 mg (25 mg Oral Given 03/31/17 1613)  sodium chloride 0.9 % bolus 1,000 mL (0 mLs Intravenous Stopped 03/31/17 2345)  ketorolac (TORADOL) 30 MG/ML injection 30 mg (30 mg Intravenous Given 03/31/17 2208)  losartan (COZAAR) tablet 100 mg (100 mg Oral Given 04/01/17 0031)  furosemide (LASIX) tablet 40 mg (40 mg Oral Given 03/31/17 2343)  fluconazole (DIFLUCAN) tablet 200 mg (200 mg Oral Given 04/01/17 0031)     Initial Impression / Assessment and Plan / ED Course  I have reviewed the triage vital signs and the nursing notes.  Pertinent labs & imaging results that were available during my care of the patient were reviewed by me and considered in my medical decision making (see chart for details).  Clinical Course as of Apr 01 130  Thu Mar 31, 2017  2332 Hypertensive.  Her blood pressure was better upon her initial arrival.  She has not had any of her hypertension medications.  Will give home meds and reassess.  She  remains without neurologic deficit. BP: (!) 193/108 [HM]  2354 Current BP: 160/90 Headache resolved No return of chest pain  [HM]    Clinical Course User Index [HM] Kajal Scalici, Jarrett Soho, PA-C    Presents with multiple complaints.  She has a headache that has been persistent for  several weeks.  Oral medications given at triage.  CT scan without acute abnormality.  Normal neurologic exam.  No evidence of intracranial hemorrhage, intracranial mass, CVA or TIA.  Patient given Toradol with complete resolution of her headache.  She reports she feels significantly better at this time.  Patient was found to be hypertensive however she has been without her home medications for approximately 12 hours.  Home medications given and blood pressure has improved.  Patient also with complaints of chest pain onset last night and resolved this morning.  She had sick contacts.  Her chest pain has not returned.  She reports she feels well.  No additional episodes of nausea and vomiting.  She has tolerated p.o. here in the emergency department without difficulty.  No persistent emesis.  No international travel.  Abdomen is soft and nontender on initial and repeat abdominal exam.  Also with complaints of vaginal itching similar to previous episodes of yeast infection.  She is hyperglycemic today.  Elevation in serum creatinine.  I suspect this is secondary to dehydration.  Fluids given.  She reports her primary care provider has been adjusting her diabetes medications.  Suspect she does have a yeast infection.  She does not wish for pelvic exam.  She is low risk for STD.  Diflucan given here in the emergency department.  Patient is to follow-up with her primary care provider for all of these complaints but specifically for recheck of her blood sugar and creatinine.  Patient was given fluids here in the emergency department.  BP (!) 160/90   Pulse 72   Temp 98 F (36.7 C) (Oral)   Resp 17   Ht 5\' 8"  (1.727 m)   Wt  123.8 kg (273 lb)   SpO2 95%   BMI 41.51 kg/m   The patient was discussed with Dr. Ralene Bathe who agrees with the treatment plan.   Final Clinical Impressions(s) / ED Diagnoses   Final diagnoses:  Essential hypertension  Hyperglycemia  Non-intractable vomiting with nausea, unspecified vomiting type  Acute vaginitis  Left-sided headache    ED Discharge Orders    None       Harlem Thresher, Gwenlyn Perking 04/01/17 Marnette Burgess, MD 04/01/17 1301

## 2017-03-31 NOTE — ED Triage Notes (Signed)
Pt presents to ED for assessment of frontal headache x 2 days with nausea, light-sensitivity, and occasional blurred vision.  No neuro deficits noted in triage.

## 2017-03-31 NOTE — ED Provider Notes (Signed)
  Medical screening exam  57 year old female presents today with complaints of headache and chest pain.  Patient notes headache since before Christmas.  She has been seen by her primary care and prescribed muscle relaxers as this was thought to be a tension headache.  She notes no significant improvement in her symptoms.  She also notes left-sided shoulder pain worse with range of motion and palpation.  Last night patient experienced an episode of chest tightness with nausea and reported vomiting.  She notes symptoms have improved since this morning.   Patient will be treated for the headache, screening labs ordered and placed in waiting room pending acute bed.    Vitals:   03/31/17 1344  BP: (!) 158/99  Pulse: 85  Resp: 16  Temp: 98 F (36.7 C)  SpO2: 98%     Okey Regal, PA-C 03/31/17 1551    Nat Christen, MD 04/02/17 [phone]

## 2017-04-01 DIAGNOSIS — R51 Headache: Secondary | ICD-10-CM | POA: Diagnosis not present

## 2017-04-01 NOTE — Discharge Instructions (Signed)
1. Medications: usual home medications 2. Treatment: rest, drink plenty of fluids, 3. Follow Up: Please followup with your primary doctor in 1-2 days for discussion of your diagnoses and for repeat lab work; Please return to the ER for acute onset headache, persistent vomiting, persistent chest pain or blurred vision, high fevers, abdominal pain or any other concerns.

## 2017-04-04 MED FILL — metFORMIN HCL 1000 MG TABS: 1000 | 90 days supply | Qty: 180 | Fill #0

## 2017-04-04 MED FILL — TRESIBA FLEXTOUCH 200 UNITS: 200 | 41 days supply | Qty: 9 | Fill #3

## 2017-04-04 MED FILL — GABAPENTIN 300 MG CAPSULE: 300 | 45 days supply | Qty: 180 | Fill #0

## 2017-04-05 MED FILL — TELMISARTAN 80 MG TABS: 80 | 30 days supply | Qty: 30 | Fill #0

## 2017-04-05 MED FILL — HYDROCHLOROTHIAZIDE 25 MG T: 25 | 30 days supply | Qty: 30 | Fill #0

## 2017-04-06 ENCOUNTER — Encounter: Payer: Self-pay | Admitting: Neurology

## 2017-04-06 ENCOUNTER — Ambulatory Visit (INDEPENDENT_AMBULATORY_CARE_PROVIDER_SITE_OTHER): Payer: Medicare PPO | Admitting: Neurology

## 2017-04-06 VITALS — BP 123/82 | HR 85 | Ht 68.0 in | Wt 267.0 lb

## 2017-04-06 DIAGNOSIS — R9439 Abnormal result of other cardiovascular function study: Secondary | ICD-10-CM | POA: Diagnosis not present

## 2017-04-06 DIAGNOSIS — R0683 Snoring: Secondary | ICD-10-CM | POA: Diagnosis not present

## 2017-04-06 DIAGNOSIS — R51 Headache: Secondary | ICD-10-CM

## 2017-04-06 DIAGNOSIS — G4719 Other hypersomnia: Secondary | ICD-10-CM | POA: Diagnosis not present

## 2017-04-06 DIAGNOSIS — R351 Nocturia: Secondary | ICD-10-CM

## 2017-04-06 DIAGNOSIS — R519 Headache, unspecified: Secondary | ICD-10-CM

## 2017-04-06 DIAGNOSIS — Z6841 Body Mass Index (BMI) 40.0 and over, adult: Secondary | ICD-10-CM

## 2017-04-06 NOTE — Progress Notes (Signed)
Subjective:    Patient ID: Sarah Gallegos is a 57 y.o. female.  HPI     Star Age, MD, PhD [facility] [address], [address]O. [place], [place] [address]  Dear Dr. Virgina Jock,   I saw your patient, Sarah Gallegos, upon your kind request in my neurologic clinic today for initial consultation of her sleep disorder, in particular, concern for underlying obstructive sleep apnea. The patient is unaccompanied today. As you know, Sarah Gallegos is a 57 year old right-handed woman with an underlying complex medical history of hypertension, Graves' disease with status post radioiodine treatment as I understand, hyperlipidemia, uncontrolled diabetes, history of abnormal nuclear stress test in 2017, exertional dyspnea, hypothyroidism, lower extremity swelling, low back pain, and morbid obesity with a BMI of over 40, who reports snoring and excessive daytime somnolence. I reviewed your office note from 02/02/2017 which you kindly included. Of note, patient presented to the emergency room on 03/31/2017 with headache, chest pain, symptoms of vaginal yeast infection. She was treated symptomatically. CT head was negative for acute changes. Her Epworth sleepiness score is 14 out of 24 today, fatigue score is 45 out of 63. She is married and lives with her husband. She does not work. She quit smoking in 1987 and does not drink alcohol. She quit marijuana in 1987, does not typically drink caffeine. He husband had a sleep study at the [place]. Her son had a sleep study done. She has 2 sons and 1 daughter, 8 GC.  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 She reports significant nocturia about 3-4 times per average night, occasional morning headaches, has a history of breast leg syndrome. She has difficulty falling asleep and staying asleep. She used to take Ambien but her insurance did not cover it any longer. She now takes melatonin nightly. Bedtime is around 9 PM, rice  time around 7 but she does have multiple nighttime awakenings. Her husband typically sleeps in a different bedroom.  Her Past Medical History Is Significant For: Past Medical History:  Diagnosis Date  . Diabetes mellitus without complication ([place])   . Hypercholesteremia   . Hypertension   . Hypothyroidism   . Neuropathy     Her Past Surgical History Is Significant For: Past Surgical History:  Procedure Laterality Date  . ABDOMINAL HYSTERECTOMY     2008  . BACK SURGERY     2003, [facility], [place], [place]  . BREAST BIOPSY     2003, [facility], [place], [place] SURGERY     2010,  for graves disease. [place], [place]     2003, [facility], [place], [place]    Her Family History Is Significant For: Family History  Problem Relation Age of Onset  . Heart failure Mother   . Stroke Mother   . Hypertension Father     Her Social History Is Significant For: Social History   Socioeconomic History  . Marital status: Married    Spouse name: None  . Number of children: None  . Years of education: None  . Highest education level: None  Social Needs  . Financial resource strain: None  . Food insecurity - worry: None  . Food insecurity - inability: None  . Transportation needs - medical: None  . Transportation needs - non-medical: None  Occupational History  . Occupation: unemployed  Tobacco Use  . Smoking status: Former Smoker    Packs/day: 1.00  Years: 7.00    Pack years: 7.00    Types: Cigarettes  . Smokeless tobacco: Never Used  . Tobacco comment: quit in 1987  Substance and Sexual Activity  . Alcohol use: No    Alcohol/week: 0.0 oz  . Drug use: No  . Sexual activity: None  Other Topics Concern  . None  Social History Narrative  . None    Her Allergies Are:  Allergies  Allergen Reactions  . Lantus [Insulin Glargine] Rash and Other (See Comments)    Face breaks out really bad  . Tradjenta [Linagliptin] Rash and  Other (See Comments)    Face breaks out  :   Her Current Medications Are:  Outpatient Encounter Medications as of 04/06/2017  Medication Sig  . aspirin 81 MG chewable tablet Chew 81 mg by mouth daily.  . carvedilol (COREG) 6.25 MG tablet Take 6.25 mg by mouth 2 (two) times daily with a meal.  . Cholecalciferol (VITAMIN D-3) 5000 units TABS Take 5,000 Units by mouth daily.  . dapagliflozin propanediol (FARXIGA) 10 MG TABS tablet Take 10 mg by mouth daily.  . furosemide (LASIX) 40 MG tablet Take 40 mg by mouth 2 (two) times daily.  Marland Kitchen gabapentin (NEURONTIN) 300 MG capsule Take 600 mg by mouth 2 (two) times daily.   . Insulin Degludec (TRESIBA FLEXTOUCH) 200 UNIT/ML SOPN Inject 43 Units into the skin at bedtime.  Marland Kitchen levothyroxine (SYNTHROID, LEVOTHROID) 200 MCG tablet Take 200 mcg by mouth daily before breakfast.  . losartan-hydrochlorothiazide (HYZAAR) 100-25 MG tablet Take 1 tablet by mouth daily.  Marland Kitchen MELATONIN PO Take 1 tablet by mouth daily.  . metFORMIN (GLUCOPHAGE) 1000 MG tablet Take 1,000 mg by mouth 2 (two) times daily with a meal.  . omeprazole (PRILOSEC) 40 MG capsule Take 40 mg by mouth daily.   . potassium chloride (K-DUR,KLOR-CON) 10 MEQ tablet Take 10 mEq by mouth 2 (two) times daily.  . rosuvastatin (CRESTOR) 20 MG tablet Take 20 mg by mouth daily.  . Semaglutide (OZEMPIC) 0.25 or 0.5 MG/DOSE SOPN Inject 0.5 mg into the skin once a week.  . [DISCONTINUED] lisinopril (PRINIVIL) 10 MG tablet Take 1 tablet (10 mg total) by mouth daily. (Patient not taking: Reported on 03/31/2017)  . [DISCONTINUED] simvastatin (ZOCOR) 20 MG tablet Take 20 mg by mouth daily.    No facility-administered encounter medications on file as of 04/06/2017.   :  Review of Systems:  Out of a complete 14 point review of systems, all are reviewed and negative with the exception of these symptoms as listed below: Review of Systems  Neurological:       Pt presents today to discuss her sleep. Pt has never had a  sleep study but does endorse snoring.  Epworth Sleepiness Scale 0= would never doze 1= slight chance of dozing 2= moderate chance of dozing 3= high chance of dozing  Sitting and reading: 2 Watching TV: 3 Sitting inactive in a public place (ex. Theater or meeting): 1 As a passenger in a car for an hour without a break: 1 Lying down to rest in the afternoon: 3 Sitting and talking to someone: 1 Sitting quietly after lunch (no alcohol): 3 In a car, while stopped in traffic: 0 Total: 14     Objective:  Neurological Exam  Physical Exam Physical Examination:   Vitals:   04/06/17 0854  BP: 123/82  Pulse: 85    General Examination: The patient is a very pleasant 57 y.o. female in no acute distress.  She appears well-developed and well-nourished and well groomed.   HEENT: Normocephalic, atraumatic, pupils are equal, round and reactive to light and accommodation. Extraocular tracking is good without limitation to gaze excursion or nystagmus noted. Normal smooth pursuit is noted. Mild proptosis b/l. Hearing is grossly intact. , mildFace is symmetric with normal facial animation and normal facial sensation. Speech is clear with no dysarthria noted. There is no hypophonia. There is no lip, neck/head, jaw or voice tremor. Neck is supple with full range of passive and active motion. There are no carotid bruits on auscultation. Oropharynx exam reveals: mild mouth dryness, adequate dental hygiene and moderate airway crowding, due to  smaller airway entry, longer tongue, elongated uvula, tonsils in place which are about 1-2+ bilaterally. Mallampati is class II. Tongue protrudes centrally and palate elevates symmetrically. Neck size is 17.5 inches. She has a mild underbite.   Chest: Clear to auscultation without wheezing, rhonchi or crackles noted.  Heart: S1+S2+0, regular and normal without murmurs, rubs or gallops noted.   Abdomen: Soft, non-tender and non-distended with normal bowel sounds  appreciated on auscultation.  Extremities: There is 1+ edema in the right lower extremity distally, 2+ edema in the left lower extremity distally, particularly around the ankle and dorsum of the foot.   Skin: Warm and dry without trophic changes noted.  Musculoskeletal: exam reveals no obvious joint deformities, tenderness or joint swelling or erythema with the exception of bilateral knee pain, right more than left with several popping sounds or she moves.   Neurologically:  Mental status: The patient is awake, alert and oriented in all 4 spheres. Her immediate and remote memory, attention, language skills and fund of knowledge are appropriate. There is no evidence of aphasia, agnosia, apraxia or anomia. Speech is clear with normal prosody and enunciation. Thought process is linear. Mood is normal and affect is normal.  Cranial nerves II - XII are as described above under HEENT exam. In addition: shoulder shrug is normal with equal shoulder height noted. Motor exam: Normal bulk, strength and tone is noted. There is no drift, tremor or rebound. Romberg is negative. Reflexes are 1+ in the upper extremities, absent in the lower extremities. Fine motor skills are grossly intact.   Cerebellar testing: No dysmetria or intention tremor. There is no truncal or gait ataxia.  Sensory exam: intact to light touch in the upper and lower extremities.  Gait, station and balance: She stands with difficulty. No veering to one side is noted. No leaning to one side is noted. Posture is mildly stooped and she walks slowly and cautiously, slight limp on the right, tandem walk is not possible safely.   Assessment and Plan:   In summary, Sarah Gallegos is a very pleasant 57 y.o.-year old female with an underlying complex medical history of hypertension, Graves' disease with status post radioiodine treatment as I understand, hyperlipidemia, uncontrolled diabetes, history of abnormal nuclear stress test in 2017,  exertional dyspnea, hypothyroidism, lower extremity swelling, low back pain, and morbid obesity with a BMI of over 40, whose history and physical exam are concerning for obstructive sleep apnea (OSA). I had a long chat with the patient about my findings and the diagnosis of OSA, its prognosis and treatment options. We talked about medical treatments, surgical interventions and non-pharmacological approaches. I explained in particular the risks and ramifications of untreated moderate to severe OSA, especially with respect to developing cardiovascular disease down the Road, including congestive heart failure, difficult to treat hypertension, cardiac arrhythmias, or stroke.  Even type 2 diabetes has, in part, been linked to untreated OSA. Symptoms of untreated OSA include daytime sleepiness, memory problems, mood irritability and mood disorder such as depression and anxiety, lack of energy, as well as recurrent headaches, especially morning headaches. We talked about trying to maintain a healthy lifestyle in general, as well as the importance of weight control. I encouraged the patient to eat healthy, exercise daily and keep well hydrated, to keep a scheduled bedtime and wake time routine, to not skip any meals and eat healthy snacks in between meals. I advised the patient not to drive when feeling sleepy. I recommended the following at this time: sleep study with potential positive airway pressure titration. (We will score hypopneas at 4%). She has apprehension about the sleep study, she was reassured. She had several questions which I answered.  I explained the sleep test procedure to the patient and also outlined possible surgical and non-surgical treatment options of OSA, including the use of a custom-made dental device (which would require a referral to a specialist dentist or oral surgeon), upper airway surgical options, such as pillar implants, radiofrequency surgery, tongue base surgery, and UPPP (which  would involve a referral to an ENT surgeon). Rarely, jaw surgery such as mandibular advancement may be considered.  I also explained the CPAP treatment option to the patient, who indicated that she would be willing to try CPAP if the need arises. I explained the importance of being compliant with PAP treatment, not only for insurance purposes but primarily to improve Her symptoms, and for the patient's long term health benefit, including to reduce Her cardiovascular risks. I answered all her questions today and the patient was in agreement. I would like to see her back after the sleep study is completed and encouraged her to call with any interim questions, concerns, problems or updates.  Thank you very much for allowing me to participate in the care of this nice patient. If I can be of any further assistance to you please do not hesitate to call me at [phone].  Sincerely,   Star Age, MD, PhD

## 2017-04-06 NOTE — Patient Instructions (Addendum)

## 2017-04-07 DIAGNOSIS — E1165 Type 2 diabetes mellitus with hyperglycemia: Secondary | ICD-10-CM | POA: Diagnosis not present

## 2017-04-07 DIAGNOSIS — R9439 Abnormal result of other cardiovascular function study: Secondary | ICD-10-CM | POA: Diagnosis not present

## 2017-04-07 DIAGNOSIS — E118 Type 2 diabetes mellitus with unspecified complications: Secondary | ICD-10-CM | POA: Diagnosis not present

## 2017-04-07 DIAGNOSIS — I5030 Unspecified diastolic (congestive) heart failure: Secondary | ICD-10-CM | POA: Diagnosis not present

## 2017-04-12 ENCOUNTER — Ambulatory Visit: Payer: Medicare PPO

## 2017-04-12 ENCOUNTER — Ambulatory Visit
Admission: RE | Admit: 2017-04-12 | Discharge: 2017-04-12 | Disposition: A | Discharge status: Home / Self Care | Payer: Medicare PPO | Source: Ambulatory Visit | Attending: Internal Medicine | Admitting: Internal Medicine

## 2017-04-12 DIAGNOSIS — N6002 Solitary cyst of left breast: Secondary | ICD-10-CM

## 2017-04-12 DIAGNOSIS — R928 Other abnormal and inconclusive findings on diagnostic imaging of breast: Secondary | ICD-10-CM | POA: Diagnosis not present

## 2017-04-14 MED FILL — ROSUVASTATIN CALCIUM 20 MG: 20 | 90 days supply | Qty: 90 | Fill #2

## 2017-04-15 DIAGNOSIS — R079 Chest pain, unspecified: Secondary | ICD-10-CM | POA: Diagnosis not present

## 2017-04-15 DIAGNOSIS — I10 Essential (primary) hypertension: Secondary | ICD-10-CM | POA: Diagnosis not present

## 2017-04-15 DIAGNOSIS — R350 Frequency of micturition: Secondary | ICD-10-CM | POA: Diagnosis not present

## 2017-04-15 DIAGNOSIS — R51 Headache: Secondary | ICD-10-CM | POA: Diagnosis not present

## 2017-04-15 DIAGNOSIS — R109 Unspecified abdominal pain: Secondary | ICD-10-CM | POA: Diagnosis not present

## 2017-04-27 ENCOUNTER — Telehealth: Payer: Self-pay | Admitting: Neurology

## 2017-04-27 NOTE — Telephone Encounter (Signed)
We have attempted to call the patient two times to schedule sleep study. Patient has been unavailable at the phone numbers we have on file, and has not returned our calls. At this time, we will send a letter asking patient to please contact the sleep lab.  °

## 2017-04-29 MED FILL — HYDROCHLOROTHIAZIDE 25 MG T: 25 | 30 days supply | Qty: 30 | Fill #1

## 2017-04-29 MED FILL — OMEPRAZOLE DR 40 MG CAPSULE: 40 | 30 days supply | Qty: 30 | Fill #3

## 2017-04-29 MED FILL — TELMISARTAN 80 MG TABS: 80 | 30 days supply | Qty: 30 | Fill #1

## 2017-05-09 MED FILL — POTASSIUM CL ER 10 MEQ TAB: 10 | 90 days supply | Qty: 90 | Fill #1

## 2017-05-23 MED FILL — FUROSEMIDE 40 MG TAB: 40 | 60 days supply | Qty: 120 | Fill #1

## 2017-05-23 MED FILL — TRESIBA FLEXTOUCH 200 UNITS: 200 | 41 days supply | Qty: 9 | Fill #4

## 2017-05-23 MED FILL — OMEPRAZOLE DR 40 MG CAPSULE: 40 | 30 days supply | Qty: 30 | Fill #0

## 2017-06-02 DIAGNOSIS — E1140 Type 2 diabetes mellitus with diabetic neuropathy, unspecified: Secondary | ICD-10-CM | POA: Diagnosis not present

## 2017-06-02 DIAGNOSIS — R1310 Dysphagia, unspecified: Secondary | ICD-10-CM | POA: Diagnosis not present

## 2017-06-02 DIAGNOSIS — K5900 Constipation, unspecified: Secondary | ICD-10-CM | POA: Diagnosis not present

## 2017-06-02 DIAGNOSIS — I10 Essential (primary) hypertension: Secondary | ICD-10-CM | POA: Diagnosis not present

## 2017-06-02 DIAGNOSIS — E039 Hypothyroidism, unspecified: Secondary | ICD-10-CM | POA: Diagnosis not present

## 2017-06-06 MED FILL — HYDROCHLOROTHIAZIDE 25 MG T: 25 | 30 days supply | Qty: 30 | Fill #0

## 2017-06-06 MED FILL — TELMISARTAN 80 MG TABS: 80 | 30 days supply | Qty: 30 | Fill #0

## 2017-06-17 ENCOUNTER — Other Ambulatory Visit (HOSPITAL_COMMUNITY): Payer: Self-pay | Admitting: Internal Medicine

## 2017-06-17 DIAGNOSIS — R1310 Dysphagia, unspecified: Secondary | ICD-10-CM

## 2017-06-27 MED FILL — GABAPENTIN 300 MG CAPSULE: 300 | 45 days supply | Qty: 180 | Fill #1

## 2017-06-29 ENCOUNTER — Ambulatory Visit (HOSPITAL_COMMUNITY)
Admission: RE | Admit: 2017-06-29 | Discharge: 2017-06-29 | Disposition: A | Discharge status: Home / Self Care | Payer: Medicare PPO | Source: Ambulatory Visit | Attending: Internal Medicine | Admitting: Internal Medicine

## 2017-06-29 DIAGNOSIS — K224 Dyskinesia of esophagus: Secondary | ICD-10-CM | POA: Insufficient documentation

## 2017-06-29 DIAGNOSIS — K449 Diaphragmatic hernia without obstruction or gangrene: Secondary | ICD-10-CM | POA: Insufficient documentation

## 2017-06-29 DIAGNOSIS — K219 Gastro-esophageal reflux disease without esophagitis: Secondary | ICD-10-CM | POA: Insufficient documentation

## 2017-06-29 DIAGNOSIS — R1310 Dysphagia, unspecified: Secondary | ICD-10-CM | POA: Diagnosis not present

## 2017-07-04 MED FILL — OMEPRAZOLE DR 40 MG CAPSULE: 40 | 90 days supply | Qty: 90 | Fill #0

## 2017-07-04 MED FILL — metFORMIN HCL 1000 MG TABS: 1000 | 90 days supply | Qty: 180 | Fill #0

## 2017-07-06 DIAGNOSIS — I5030 Unspecified diastolic (congestive) heart failure: Secondary | ICD-10-CM | POA: Diagnosis not present

## 2017-07-06 DIAGNOSIS — Z794 Long term (current) use of insulin: Secondary | ICD-10-CM | POA: Diagnosis not present

## 2017-07-06 DIAGNOSIS — E1165 Type 2 diabetes mellitus with hyperglycemia: Secondary | ICD-10-CM | POA: Diagnosis not present

## 2017-07-06 DIAGNOSIS — R9439 Abnormal result of other cardiovascular function study: Secondary | ICD-10-CM | POA: Diagnosis not present

## 2017-07-06 MED FILL — TRESIBA FLEXTOUCH 200 UNITS: 200 | 41 days supply | Qty: 9 | Fill #5

## 2017-07-06 MED FILL — OZEMPIC 0.25 OR 0.5 MG/DOSE: 2 | 84 days supply | Qty: 5 | Fill #1

## 2017-07-10 ENCOUNTER — Ambulatory Visit (INDEPENDENT_AMBULATORY_CARE_PROVIDER_SITE_OTHER): Payer: Medicare PPO | Admitting: Neurology

## 2017-07-10 DIAGNOSIS — R9439 Abnormal result of other cardiovascular function study: Secondary | ICD-10-CM

## 2017-07-10 DIAGNOSIS — R9431 Abnormal electrocardiogram [ECG] [EKG]: Secondary | ICD-10-CM

## 2017-07-10 DIAGNOSIS — Z6841 Body Mass Index (BMI) 40.0 and over, adult: Secondary | ICD-10-CM

## 2017-07-10 DIAGNOSIS — G4710 Hypersomnia, unspecified: Secondary | ICD-10-CM

## 2017-07-10 DIAGNOSIS — R351 Nocturia: Secondary | ICD-10-CM

## 2017-07-10 DIAGNOSIS — G4720 Circadian rhythm sleep disorder, unspecified type: Secondary | ICD-10-CM

## 2017-07-10 DIAGNOSIS — R519 Headache, unspecified: Secondary | ICD-10-CM

## 2017-07-10 DIAGNOSIS — G4719 Other hypersomnia: Secondary | ICD-10-CM

## 2017-07-10 DIAGNOSIS — R0683 Snoring: Secondary | ICD-10-CM

## 2017-07-10 DIAGNOSIS — R51 Headache: Secondary | ICD-10-CM

## 2017-07-11 MED FILL — SYNTHROID 200 MCG TABLET: 200 | 90 days supply | Qty: 90 | Fill #0

## 2017-07-11 MED FILL — HYDROCHLOROTHIAZIDE 25 MG T: 25 | 30 days supply | Qty: 30 | Fill #1

## 2017-07-11 MED FILL — TELMISARTAN 80 MG TABS: 80 | 30 days supply | Qty: 30 | Fill #1

## 2017-07-18 ENCOUNTER — Telehealth: Payer: Self-pay

## 2017-07-18 NOTE — Progress Notes (Signed)
Patient referred by Dr. Virgina Jock, seen by me on 04/06/17, diagnostic PSG on 07/10/17.   Please call and notify the patient that the recent sleep study did not show any significant obstructive sleep apnea with the exception of snoring and mild REM related and supine OSA. For this, CPAP therapy is not warranted, but weight loss and avoiding the supine sleep position are highly recommended. For disturbing snoring, an oral appliance (through a qualified dentist) can be considered.  Please remind patient to try to maintain good sleep hygiene, which means: Keep a regular sleep and wake schedule and make enough time for sleep (7 1/2 to 8 1/2 hours for the average adult), try not to exercise or have a meal within 2 hours of your bedtime, try to keep your bedroom conducive for sleep, that is, cool and dark, without light distractors such as an illuminated alarm clock, and refrain from watching TV right before sleep or in the middle of the night and do not keep the TV or radio on during the night. If a nightlight is used, have it away from the visual field. Also, try not to use or play on electronic devices at bedtime, such as your cell phone, tablet PC or laptop. If you like to read at bedtime on an electronic device, try to dim the background light as much as possible. Do not eat in the middle of the night. Keep pets away from the bedroom environment. For stress relief, try meditation, deep breathing exercises (there are many books and CDs available), a white noise machine or fan can help to diffuse other noise distractors, such as traffic noise. Do not drink alcohol before bedtime, as it can disturb sleep and cause middle of the night awakenings. Never mix alcohol and sedating medications! Avoid narcotic pain medication close to bedtime, as opioids/narcotics can suppress breathing drive and breathing effort.   Please inform patient that she can FU with her PCP and cardiologist.   Thanks,  Star Age, MD,  PhD [facility] ([place])

## 2017-07-18 NOTE — Telephone Encounter (Signed)
I called pt. I advised pt that Dr. Rexene Alberts reviewed pt's sleep study and found that pt did not show any significant osa with th exception of snoring and mild REM osa and supine osa. Dr. Rexene Alberts recommends that pt pursue weight loss and avoid supine sleep. If snoring is disturbing, pt can discuss with her dentist an oral appliance. Pt reports that her snoring is not disturbing to her. I reviewed sleep hygiene recommendations with the pt, including trying to keep a regular sleep wake schedule, avoiding electronics in the bedroom, keeping the bedroom cool, dark, and quiet, and avoiding eating or exercising within 2 hours of bedtime as well as eating in the middle of the night. I advised pt to keep pets out of the bedroom. I discussed with pt the importance of stress relief and to try meditation, deep breathing exercises, and/or a white noise machine or fan to diffuse other noise distractors. I advised pt to not drink alcohol before bedtime and to never mix alcohol and sedating medications. Pt was advised to avoid narcotic pain medication close to bedtime. I advised pt that a copy of these sleep study results will be sent to Dr. Robb Matar. Pt verbalized understanding of results. Pt had no questions at this time but was encouraged to call back if questions arise.

## 2017-07-18 NOTE — Procedures (Signed)
PATIENT'S NAME:  Sarah Gallegos, Sarah Gallegos DOB:      01/07/1961      MR#:    [id number]     DATE OF RECORDING: 07/10/2017 REFERRING M.D.: Dr. Vernell Leep, MD Study Performed:   Baseline Polysomnogram HISTORY: 57 year old woman with a history of hypertension, Graves' disease with status post radioiodine treatment as I understand, hyperlipidemia, uncontrolled diabetes, history of abnormal nuclear stress test in 2017, exertional dyspnea, hypothyroidism, lower extremity swelling, low back pain, and morbid obesity with a BMI of over 40, who reports snoring and excessive daytime somnolence. The patient endorsed the Epworth Sleepiness Scale at 14/24 points. The patient's weight 267 pounds with a height of 68 (inches), resulting in a BMI of 40.4 kg/m2. The patient's neck circumference measured 17.5 inches.  CURRENT MEDICATIONS: Aspirin, Coreg, Vitamin D3, Farxiga, Lasix, Neurontin, Tresiba, Synthroid, Hyzaar, Melatonin, Glucophage, Prilosec, Klor-con, Crestor, Ozempic.   PROCEDURE:  This is a multichannel digital polysomnogram utilizing the Somnostar 11.2 system.  Electrodes and sensors were applied and monitored per AASM Specifications.   EEG, EOG, Chin and Limb EMG, were sampled at 200 Hz.  ECG, Snore and Nasal Pressure, Thermal Airflow, Respiratory Effort, CPAP Flow and Pressure, Oximetry was sampled at 50 Hz. Digital video and audio were recorded.      BASELINE STUDY  Lights Out was at 22:25 and Lights On at 05:00.  Total recording time (TRT) was 395.5 minutes, with a total sleep time (TST) of  358 minutes.   The patient's sleep latency was 7.5 minutes. REM latency was 133 minutes, which is mildly delayed. The sleep efficiency was 90.5 %.     SLEEP ARCHITECTURE: WASO (Wake after sleep onset) was 29.5 minutes with mild sleep fragmentation noted. There were 2.5 minutes in Stage N1, 269.5 minutes Stage N2, 21.5 minutes Stage N3 and 64.5 minutes in Stage REM.  The percentage of Stage N1 was .7%, Stage N2 was  75.3%, which is increased, Stage N3 was 6.% and Stage R (REM sleep) was 18.%. The arousals were noted as: 13 were spontaneous, 0 were associated with PLMs, 1 were associated with respiratory events.  Audio and video analysis did not show any abnormal or unusual movements, behaviors, phonations or vocalizations. The patient took 1 bathroom break. Mild snoring was noted. The EKG was in keeping with normal sinus rhythm (NSR) with occasional PVCs noted.   RESPIRATORY ANALYSIS:  There were a total of 20 respiratory events:  4 obstructive apneas, 0 central apneas and 0 mixed apneas with a total of 4 apneas and an apnea index (AI) of .7 /hour. There were 16 hypopneas with a hypopnea index of 2.7 /hour. The patient also had 0 respiratory event related arousals (RERAs).      The total APNEA/HYPOPNEA INDEX (AHI) was 3.4/hour and the total RESPIRATORY DISTURBANCE INDEX was 3.4 /hour.  9 events occurred in REM sleep and 16 events in NREM. The REM AHI was 8.4 /hour, versus a non-REM AHI of 2.2. The patient spent 84.5 minutes of total sleep time in the supine position and 274 minutes in non-supine.. The supine AHI was 9.2 versus a non-supine AHI of 1.5.  OXYGEN SATURATION & C02:  The Wake baseline 02 saturation was 96%, with the lowest being 79%. Time spent below 89% saturation equaled less than 52 minutes (due to errors noted).  PERIODIC LIMB MOVEMENTS: The patient had a total of 0 Periodic Limb Movements.  The Periodic Limb Movement (PLM) index was 0 and the PLM Arousal index was 0/hour.  Post-study,  the patient indicated that sleep was better than usual.   IMPRESSION:  1. Primary Snoring 2. Non-specific abnormal EKG 3. Dysfunctions associated with sleep stages or arousal from sleep  RECOMMENDATIONS:  1. This study does not demonstrate any significant obstructive or central sleep disordered breathing with the exception of snoring and mild REM related and supine OSA. For this, CPAP therapy is not warranted,  but weight loss and avoiding the supine sleep position are highly recommended. For disturbing snoring, an oral appliance (through a qualified dentist) can be considered.  2. Other causes for the patient's sleep related complaints, including circadian rhythm disturbances, an underlying mood disorder, medication effect and/or an underlying medical problem cannot be ruled out. 3. The study showed occasional PVCs on single lead EKG; clinical correlation is recommended.  4. This study shows sleep fragmentation and abnormal sleep stage percentages; these are nonspecific findings and per se do not signify an intrinsic sleep disorder or a cause for the patient's sleep-related symptoms. Causes include (but are not limited to) the first night effect of the sleep study, circadian rhythm disturbances, medication effect or an underlying mood disorder or medical problem.  5. The patient should be cautioned not to drive, work at heights, or operate dangerous or heavy equipment when tired or sleepy. Review and reiteration of good sleep hygiene measures should be pursued with any patient. 6. The patient can follow-up by her referring provider, who will be notified of the test results.  I certify that I have reviewed the entire raw data recording prior to the issuance of this report in accordance with the Standards of Accreditation of the American Academy of Sleep Medicine (AASM)   Star Age, MD, PhD Diplomat, American Board of Psychiatry and Neurology (Neurology and Sleep Medicine)

## 2017-07-18 NOTE — Telephone Encounter (Signed)
-----   Message from Star Age, MD sent at 07/18/2017  4:45 PM EDT ----- Patient referred by Dr. Virgina Jock, seen by me on 04/06/17, diagnostic PSG on 07/10/17.   Please call and notify the patient that the recent sleep study did not show any significant obstructive sleep apnea with the exception of snoring and mild REM related and supine OSA. For this, CPAP therapy is not warranted, but weight loss and avoiding the supine sleep position are highly recommended. For disturbing snoring, an oral appliance (through a qualified dentist) can be considered.  Please remind patient to try to maintain good sleep hygiene, which means: Keep a regular sleep and wake schedule and make enough time for sleep (7 1/2 to 8 1/2 hours for the average adult), try not to exercise or have a meal within 2 hours of your bedtime, try to keep your bedroom conducive for sleep, that is, cool and dark, without light distractors such as an illuminated alarm clock, and refrain from watching TV right before sleep or in the middle of the night and do not keep the TV or radio on during the night. If a nightlight is used, have it away from the visual field. Also, try not to use or play on electronic devices at bedtime, such as your cell phone, tablet PC or laptop. If you like to read at bedtime on an electronic device, try to dim the background light as much as possible. Do not eat in the middle of the night. Keep pets away from the bedroom environment. For stress relief, try meditation, deep breathing exercises (there are many books and CDs available), a white noise machine or fan can help to diffuse other noise distractors, such as traffic noise. Do not drink alcohol before bedtime, as it can disturb sleep and cause middle of the night awakenings. Never mix alcohol and sedating medications! Avoid narcotic pain medication close to bedtime, as opioids/narcotics can suppress breathing drive and breathing effort.   Please inform patient that she can FU  with her PCP and cardiologist.   Thanks,  Star Age, MD, PhD [facility] ([place])

## 2017-07-25 MED FILL — CARVEDILOL 6.25 MG TABLET: 6.25 | 90 days supply | Qty: 180 | Fill #0

## 2017-08-08 ENCOUNTER — Encounter: Payer: Self-pay | Admitting: Gastroenterology

## 2017-08-11 MED FILL — ROSUVASTATIN CALCIUM 20 MG: 20 | 90 days supply | Qty: 90 | Fill #3

## 2017-08-18 MED FILL — FUROSEMIDE 40 MG TAB: 40 | 60 days supply | Qty: 120 | Fill #0

## 2017-08-23 MED FILL — POTASSIUM CL 10 MEQ TAB SA: 10 | 90 days supply | Qty: 90 | Fill #2

## 2017-08-23 MED FILL — HYDROCHLOROTHIAZIDE 25 MG T: 25 | 30 days supply | Qty: 30 | Fill #2

## 2017-08-23 MED FILL — TELMISARTAN 80 MG TABS: 80 | 30 days supply | Qty: 30 | Fill #2

## 2017-09-21 ENCOUNTER — Encounter: Payer: Self-pay | Admitting: Internal Medicine

## 2017-09-22 DIAGNOSIS — Z1389 Encounter for screening for other disorder: Secondary | ICD-10-CM | POA: Diagnosis not present

## 2017-09-22 DIAGNOSIS — E1140 Type 2 diabetes mellitus with diabetic neuropathy, unspecified: Secondary | ICD-10-CM | POA: Diagnosis not present

## 2017-09-22 DIAGNOSIS — M2550 Pain in unspecified joint: Secondary | ICD-10-CM | POA: Diagnosis not present

## 2017-09-22 DIAGNOSIS — I10 Essential (primary) hypertension: Secondary | ICD-10-CM | POA: Diagnosis not present

## 2017-09-22 DIAGNOSIS — F329 Major depressive disorder, single episode, unspecified: Secondary | ICD-10-CM | POA: Diagnosis not present

## 2017-09-22 LAB — LIPID PANEL
Cholesterol: 109 (ref 0–200)
HDL: 35 (ref 35–70)
LDL CALC: 44
Triglycerides: 149 (ref 40–160)

## 2017-09-22 MED FILL — HYDROCHLOROTHIAZIDE 25 MG T: 25 | 30 days supply | Qty: 30 | Fill #3

## 2017-09-22 MED FILL — TELMISARTAN 80 MG TABS: 80 | 30 days supply | Qty: 30 | Fill #3

## 2017-09-22 MED FILL — OZEMPIC 0.25 OR 0.5 MG/DOSE: 2 | 84 days supply | Qty: 5 | Fill #0

## 2017-09-22 MED FILL — TRESIBA FLEXTOUCH 200 UNITS: 200 | 41 days supply | Qty: 9 | Fill #0

## 2017-09-22 MED FILL — DULoxetine HCL 20 MG CPEP: 20 | 30 days supply | Qty: 30 | Fill #0

## 2017-10-04 DIAGNOSIS — Z79899 Other long term (current) drug therapy: Secondary | ICD-10-CM | POA: Diagnosis not present

## 2017-10-04 LAB — BASIC METABOLIC PANEL
BUN: 48 — AB (ref 4–21)
CREATININE: 1.7 — AB (ref 0.5–1.1)
GLUCOSE: 240
Potassium: 4.2 (ref 3.4–5.3)
Sodium: 141 (ref 137–147)

## 2017-10-04 MED FILL — GABAPENTIN 300 MG CAPSULE: 300 | 45 days supply | Qty: 180 | Fill #0

## 2017-10-04 MED FILL — metFORMIN HCL 1000 MG TABS: 1000 | 90 days supply | Qty: 180 | Fill #0

## 2017-10-17 MED FILL — OMEPRAZOLE DR 40 MG CAPSULE: 40 | 90 days supply | Qty: 90 | Fill #1

## 2017-10-18 DIAGNOSIS — E1140 Type 2 diabetes mellitus with diabetic neuropathy, unspecified: Secondary | ICD-10-CM | POA: Diagnosis not present

## 2017-10-18 DIAGNOSIS — Z630 Problems in relationship with spouse or partner: Secondary | ICD-10-CM | POA: Diagnosis not present

## 2017-10-18 DIAGNOSIS — Z7189 Other specified counseling: Secondary | ICD-10-CM | POA: Diagnosis not present

## 2017-10-18 DIAGNOSIS — F4320 Adjustment disorder, unspecified: Secondary | ICD-10-CM | POA: Diagnosis not present

## 2017-10-27 ENCOUNTER — Encounter: Payer: Self-pay | Admitting: Gastroenterology

## 2017-10-27 ENCOUNTER — Other Ambulatory Visit (INDEPENDENT_AMBULATORY_CARE_PROVIDER_SITE_OTHER): Payer: Medicare PPO

## 2017-10-27 ENCOUNTER — Ambulatory Visit (INDEPENDENT_AMBULATORY_CARE_PROVIDER_SITE_OTHER): Payer: Medicare PPO | Admitting: Gastroenterology

## 2017-10-27 ENCOUNTER — Encounter (INDEPENDENT_AMBULATORY_CARE_PROVIDER_SITE_OTHER): Payer: Self-pay

## 2017-10-27 ENCOUNTER — Encounter

## 2017-10-27 VITALS — BP 122/68 | HR 73 | Ht 68.0 in | Wt 249.0 lb

## 2017-10-27 DIAGNOSIS — K219 Gastro-esophageal reflux disease without esophagitis: Secondary | ICD-10-CM | POA: Diagnosis not present

## 2017-10-27 DIAGNOSIS — R1319 Other dysphagia: Secondary | ICD-10-CM

## 2017-10-27 DIAGNOSIS — Z1211 Encounter for screening for malignant neoplasm of colon: Secondary | ICD-10-CM

## 2017-10-27 DIAGNOSIS — K5909 Other constipation: Secondary | ICD-10-CM

## 2017-10-27 DIAGNOSIS — Z1212 Encounter for screening for malignant neoplasm of rectum: Secondary | ICD-10-CM

## 2017-10-27 DIAGNOSIS — R110 Nausea: Secondary | ICD-10-CM

## 2017-10-27 LAB — COMPREHENSIVE METABOLIC PANEL
ALT: 18 U/L (ref 0–35)
AST: 13 U/L (ref 0–37)
Albumin: 4 g/dL (ref 3.5–5.2)
Alkaline Phosphatase: 75 U/L (ref 39–117)
BUN: 49 mg/dL — ABNORMAL HIGH (ref 6–23)
CALCIUM: 10.6 mg/dL — AB (ref 8.4–10.5)
CO2: 36 mEq/L — ABNORMAL HIGH (ref 19–32)
Chloride: 95 mEq/L — ABNORMAL LOW (ref 96–112)
Creatinine, Ser: 1.93 mg/dL — ABNORMAL HIGH (ref 0.40–1.20)
GFR: 34.49 mL/min — AB (ref >60.00)
GLUCOSE: 303 mg/dL — AB (ref 70–99)
Potassium: 3.8 mEq/L (ref 3.5–5.1)
Sodium: 140 mEq/L (ref 135–145)
Total Bilirubin: 0.4 mg/dL (ref 0.2–1.2)
Total Protein: 7.7 g/dL (ref 6.0–8.3)

## 2017-10-27 LAB — CBC WITH DIFFERENTIAL/PLATELET
BASOS ABS: 0.1 10*3/uL (ref 0.0–0.1)
Basophils Relative: 1 % (ref 0.0–3.0)
Eosinophils Absolute: 0.1 10*3/uL (ref 0.0–0.7)
Eosinophils Relative: 1 % (ref 0.0–5.0)
HCT: 38.3 % (ref 36.0–46.0)
Hemoglobin: 12.5 g/dL (ref 12.0–15.0)
LYMPHS ABS: 2.8 10*3/uL (ref 0.7–4.0)
LYMPHS PCT: 25.5 % (ref 12.0–46.0)
MCHC: 32.7 g/dL (ref 30.0–36.0)
MCV: 83.1 fl (ref 78.0–100.0)
MONOS PCT: 5.8 % (ref 3.0–12.0)
Monocytes Absolute: 0.6 10*3/uL (ref 0.1–1.0)
NEUTROS ABS: 7.2 10*3/uL (ref 1.4–7.7)
Neutrophils Relative %: 66.7 % (ref 43.0–77.0)
PLATELETS: 323 10*3/uL (ref 150.0–400.0)
RBC: 4.61 Mil/uL (ref 3.87–5.11)
RDW: 13.6 % (ref 11.5–15.5)
WBC: 10.9 10*3/uL — ABNORMAL HIGH (ref 4.0–10.5)

## 2017-10-27 LAB — FOLATE: Folate: 17.7 ng/mL (ref >5.9)

## 2017-10-27 LAB — SEDIMENTATION RATE: SED RATE: 83 mm/h — AB (ref 0–30)

## 2017-10-27 LAB — VITAMIN B12: Vitamin B-12: 608 pg/mL (ref 211–911)

## 2017-10-27 LAB — TSH: TSH: 1.51 u[IU]/mL (ref 0.35–4.50)

## 2017-10-27 LAB — FERRITIN: Ferritin: 299.6 ng/mL — ABNORMAL HIGH (ref 10.0–291.0)

## 2017-10-27 MED ORDER — PLENVU 140 G PO SOLR: 1.0000 | Freq: Once | ORAL | 0 refills | Status: AC

## 2017-10-27 MED ORDER — LINACLOTIDE 145 MCG PO CAPS: 145.0000 ug | ORAL_CAPSULE | Freq: Every day | ORAL | 3 refills | Status: AC

## 2017-10-27 MED FILL — LINZESS 145 MCG CAPSULE: 145 | 30 days supply | Qty: 30 | Fill #0

## 2017-10-27 NOTE — Progress Notes (Signed)
Sarah Gallegos    [id number]    05/08/1960  Primary Care Physician:Sanders, Bailey Mech, MD  Referring Physician: Glendale Chard, [place] [address] [place], [place] [address]  Chief complaint: Dysphagia  HPI: 57 year old female here for new patient visit with complaints of GERD, nausea and dysphagia to pills and food worse in the past 6 months She is taking omeprazole daily but continues to have breakthrough heartburn and nausea almost daily.  She started noticing difficulty swallowing with pills getting hung up in the past 6 months and significantly worse since April 2019.  She has history of stomach ulcers and had EGD previously with Dr. Benson Norway and also at [facility] She has abdominal pain in the epigastric and periumbilical area.  Worsening of chronic constipation, was given samples of Linzess by her primary care physician.  She cannot afford out-of-pocket expense and is currently is going through separation from her partner.  Patient is under tremendous stress with her change in life situation. Denies any weight loss, vomiting,  melena or bright red blood per rectum  No family history of GI malignancy.    Outpatient Encounter Medications as of 10/27/2017  Medication Sig  . aspirin 81 MG chewable tablet Chew 81 mg by mouth daily.  . carvedilol (COREG) 6.25 MG tablet Take 6.25 mg by mouth 2 (two) times daily with a meal.  . Cholecalciferol (VITAMIN D-3) 5000 units TABS Take 5,000 Units by mouth daily.  . dapagliflozin propanediol (FARXIGA) 10 MG TABS tablet Take 10 mg by mouth daily.  . furosemide (LASIX) 40 MG tablet Take 40 mg by mouth 2 (two) times daily.  Marland Kitchen gabapentin (NEURONTIN) 300 MG capsule Take 600 mg by mouth 2 (two) times daily.   . Insulin Degludec (TRESIBA FLEXTOUCH) 200 UNIT/ML SOPN Inject 43 Units into the skin at bedtime.  Marland Kitchen levothyroxine (SYNTHROID, LEVOTHROID) 200 MCG tablet Take 200 mcg by mouth daily before breakfast.  .  losartan-hydrochlorothiazide (HYZAAR) 100-25 MG tablet Take 1 tablet by mouth daily.  Marland Kitchen MELATONIN PO Take 1 tablet by mouth daily.  . metFORMIN (GLUCOPHAGE) 1000 MG tablet Take 1,000 mg by mouth 2 (two) times daily with a meal.  . omeprazole (PRILOSEC) 40 MG capsule Take 40 mg by mouth daily.   . potassium chloride (K-DUR,KLOR-CON) 10 MEQ tablet Take 10 mEq by mouth 2 (two) times daily.  . rosuvastatin (CRESTOR) 20 MG tablet Take 20 mg by mouth daily.  . Semaglutide (OZEMPIC) 0.25 or 0.5 MG/DOSE SOPN Inject 0.5 mg into the skin once a week.   No facility-administered encounter medications on file as of 10/27/2017.     Allergies as of 10/27/2017 - Review Complete 10/27/2017  Allergen Reaction Noted  . Lantus [insulin glargine] Rash and Other (See Comments) 01/14/2013  . Tradjenta [linagliptin] Rash and Other (See Comments) 09/23/2013    Past Medical History:  Diagnosis Date  . Diabetes mellitus without complication ([place])   . Hypercholesteremia   . Hypertension   . Hypothyroidism   . Neuropathy     Past Surgical History:  Procedure Laterality Date  . ABDOMINAL HYSTERECTOMY     2008  . BACK SURGERY     2003, [facility], [place], [place]  . BREAST BIOPSY     2003, [facility], [place], [place] SURGERY     2010,  for graves disease. [place], [place]     2003, [facility], [place], [place]  Family History  Problem Relation Age of Onset  . Heart failure Mother   . Stroke Mother   . Hypertension Father   . Colon cancer Neg Hx   . Esophageal cancer Neg Hx   . Stomach cancer Neg Hx     Social History   Socioeconomic History  . Marital status: Married    Spouse name: Not on file  . Number of children: Not on file  . Years of education: Not on file  . Highest education level: Not on file  Occupational History  . Occupation: unemployed  Social Needs  . Financial resource strain: Not on file  . Food insecurity:    Worry: Not on  file    Inability: Not on file  . Transportation needs:    Medical: Not on file    Non-medical: Not on file  Tobacco Use  . Smoking status: Former Smoker    Packs/day: 1.00    Years: 7.00    Pack years: 7.00    Types: Cigarettes  . Smokeless tobacco: Never Used  . Tobacco comment: quit in 1987  Substance and Sexual Activity  . Alcohol use: No    Alcohol/week: 0.0 oz  . Drug use: No  . Sexual activity: Not on file  Lifestyle  . Physical activity:    Days per week: Not on file    Minutes per session: Not on file  . Stress: Not on file  Relationships  . Social connections:    Talks on phone: Not on file    Gets together: Not on file    Attends religious service: Not on file    Active member of club or organization: Not on file    Attends meetings of clubs or organizations: Not on file    Relationship status: Not on file  . Intimate partner violence:    Fear of current or ex partner: Not on file    Emotionally abused: Not on file    Physically abused: Not on file    Forced sexual activity: Not on file  Other Topics Concern  . Not on file  Social History Narrative  . Not on file      Review of systems: Review of Systems  Constitutional: Negative for fever and chills.  Positive for fatigue HENT: Positive for sinus problem  eyes: Negative for blurred vision.  Respiratory: Negative for cough, shortness of breath and wheezing.   Cardiovascular: Negative for chest pain and palpitations.  Gastrointestinal: as per HPI Genitourinary: Negative for dysuria, urgency, frequency and hematuria.  Musculoskeletal: Positive for myalgias, back pain and joint pain.  Skin: Negative for itching and rash.  Neurological: Negative for dizziness, tremors, focal weakness, seizures and loss of consciousness.  Endo/Heme/Allergies: Negative for seasonal allergies.  Psychiatric/Behavioral: Negative for suicidal ideas and hallucinations.  Positive for trouble sleeping.  Positive for  depression All other systems reviewed and are negative.   Physical Exam: Vitals:   10/27/17 0926  BP: 122/68  Pulse: 73   Body mass index is 37.86 kg/m. Gen:      No acute distress HEENT:  EOMI, sclera anicteric.  Periorbital edema Neck:     No masses; no thyromegaly Lungs:    Clear to auscultation bilaterally; normal respiratory effort CV:         Regular rate and rhythm; no murmurs Abd:      + bowel sounds; soft, non-tender; no palpable masses, no distension Ext:    Mild edema; adequate peripheral perfusion Skin:  Warm and dry; no rash Neuro: alert and oriented x 3 Psych: normal mood and affect  Data Reviewed:  Reviewed labs, radiology imaging, old records and pertinent past GI work up  Barium esophagram April 2019 showed esophageal dysmotility, sliding hiatal hernia and distal esophageal ring.  30 mm tablet passed  Assessment and Plan/Recommendations:  57 year old female with history of obesity, diabetes, hypertension, hypothyroidism with complaints of worsening heartburn, nausea, abdominal pain, constipation and solid food dysphagia Scheduled for an EGD for evaluation of dysphagia and worsening GERD symptoms Patient never had colonoscopy for colorectal cancer screening, will schedule colonoscopy along with EGD The risks and benefits as well as alternatives of endoscopic procedure(s) have been discussed and reviewed. All questions answered. The patient agrees to proceed. Start Linzess 145 mcg daily Increase dietary fiber and fluid intake Check CBC, CMP, ferritin, folate, B12 and TSH Patient is depressed and was tearful during the visit, under significant stress regarding her family situation.  Encourage patient to seek counseling and therapy, she was provided information to contact behavioral medicine    K. Denzil Magnuson , MD [phone]    CC: Glendale Chard, MD

## 2017-10-27 NOTE — Patient Instructions (Addendum)
You have been scheduled for an endoscopy and colonoscopy. Please follow the written instructions given to you at your visit today. Please pick up your prep supplies at the pharmacy within the next 1-3 days. If you use inhalers (even only as needed), please bring them with you on the day of your procedure. Your physician has requested that you go to www.startemmi.com and enter the access code given to you at your visit today. This web site gives a general overview about your procedure. However, you should still follow specific instructions given to you by our office regarding your preparation for the procedure.   You may have a light breakfast the morning of prep day (the day before the procedure).   You may choose from one of the following items: eggs and toast OR chicken noodle soup and crackers.   You should have your breakfast completed between 8:00 and 9:00 am the day before your procedure.    After you have had your light breakfast you should start a clear liquid diet only, NO SOLIDS. No additional solid food is allowed. You may continue to have clear liquid up to 3 hours prior to your procedure.    Go to the basement for labs  We will send Linzess 145 mcg to your pharmacy to take once daily  We have given you information for you to contact [facility] for an appointment     .Thank you for choosing [facility]  Karleen Hampshire Nand[place]am,MD

## 2017-11-02 ENCOUNTER — Encounter: Payer: Self-pay | Admitting: Gastroenterology

## 2017-11-02 DIAGNOSIS — E1122 Type 2 diabetes mellitus with diabetic chronic kidney disease: Secondary | ICD-10-CM | POA: Diagnosis not present

## 2017-11-02 DIAGNOSIS — N183 Chronic kidney disease, stage 3 (moderate): Secondary | ICD-10-CM | POA: Diagnosis not present

## 2017-11-02 DIAGNOSIS — N179 Acute kidney failure, unspecified: Secondary | ICD-10-CM | POA: Diagnosis not present

## 2017-11-02 DIAGNOSIS — I129 Hypertensive chronic kidney disease with stage 1 through stage 4 chronic kidney disease, or unspecified chronic kidney disease: Secondary | ICD-10-CM | POA: Diagnosis not present

## 2017-11-02 DIAGNOSIS — E1129 Type 2 diabetes mellitus with other diabetic kidney complication: Secondary | ICD-10-CM | POA: Diagnosis not present

## 2017-11-04 MED FILL — SYNTHROID 200 MCG TABLET: 200 | 90 days supply | Qty: 90 | Fill #1

## 2017-11-04 MED FILL — FUROSEMIDE 40 MG TAB: 40 | 60 days supply | Qty: 120 | Fill #1

## 2017-11-04 MED FILL — HYDROCHLOROTHIAZIDE 25 MG T: 25 | 30 days supply | Qty: 30 | Fill #4

## 2017-11-05 ENCOUNTER — Other Ambulatory Visit: Payer: Self-pay | Admitting: Internal Medicine

## 2017-11-05 DIAGNOSIS — N183 Chronic kidney disease, stage 3 unspecified: Secondary | ICD-10-CM

## 2017-11-10 DIAGNOSIS — N179 Acute kidney failure, unspecified: Secondary | ICD-10-CM | POA: Diagnosis not present

## 2017-11-14 ENCOUNTER — Ambulatory Visit
Admission: RE | Admit: 2017-11-14 | Discharge: 2017-11-14 | Disposition: A | Discharge status: Home / Self Care | Payer: Medicare PPO | Source: Ambulatory Visit | Attending: Internal Medicine | Admitting: Internal Medicine

## 2017-11-14 DIAGNOSIS — N183 Chronic kidney disease, stage 3 unspecified: Secondary | ICD-10-CM

## 2017-12-08 ENCOUNTER — Ambulatory Visit (INDEPENDENT_AMBULATORY_CARE_PROVIDER_SITE_OTHER): Payer: Medicare PPO | Admitting: Internal Medicine

## 2017-12-08 ENCOUNTER — Encounter: Payer: Self-pay | Admitting: Internal Medicine

## 2017-12-08 VITALS — BP 120/82 | HR 82 | Ht 66.5 in | Wt 246.0 lb

## 2017-12-08 DIAGNOSIS — N183 Chronic kidney disease, stage 3 (moderate): Secondary | ICD-10-CM | POA: Diagnosis not present

## 2017-12-08 DIAGNOSIS — Z794 Long term (current) use of insulin: Secondary | ICD-10-CM | POA: Diagnosis not present

## 2017-12-08 DIAGNOSIS — E1122 Type 2 diabetes mellitus with diabetic chronic kidney disease: Secondary | ICD-10-CM

## 2017-12-08 LAB — POCT GLYCOSYLATED HEMOGLOBIN (HGB A1C): Hemoglobin A1C: 13 % — AB (ref 4.0–5.6)

## 2017-12-08 MED ORDER — INSULIN ASPART 100 UNIT/ML FLEXPEN: 15.0000 [IU] | PEN_INJECTOR | Freq: Three times a day (TID) | SUBCUTANEOUS | 11 refills | Status: AC

## 2017-12-08 MED FILL — NOVOLOG FLEXPEN SYRINGE: 100 | 20 days supply | Qty: 15 | Fill #0

## 2017-12-08 NOTE — Patient Instructions (Addendum)
Please continue: - Metformin 1000 mg 2x a day, with meals - Farxiga 10 mg daily in a.m. - Tresiba U200 44 units but move this to before dinner - Ozempic 0.5 mg weekly  Please add: - Novolog 15 units before dinner (can increase to 20 or 25 units if the sugars at bedtime are still high) If you eat lunch, you may need Novolog with this meal, also.  Stop Juice.  Start checking sugars 2x a day.  Please come back for a follow-up appointment in 3 months with your sugar log.   PATIENT INSTRUCTIONS FOR TYPE 2 DIABETES:  **Please join MyChart!** - see attached instructions about how to join if you have not done so already.  DIET AND EXERCISE Diet and exercise is an important part of diabetic treatment.  We recommended aerobic exercise in the form of brisk walking (working between 40-60% of maximal aerobic capacity, similar to brisk walking) for 150 minutes per week (such as 30 minutes five days per week) along with 3 times per week performing 'resistance' training (using various gauge rubber tubes with handles) 5-10 exercises involving the major muscle groups (upper body, lower body and core) performing 10-15 repetitions (or near fatigue) each exercise. Start at half the above goal but build slowly to reach the above goals. If limited by weight, joint pain, or disability, we recommend daily walking in a swimming pool with water up to waist to reduce pressure from joints while allow for adequate exercise.    BLOOD GLUCOSES Monitoring your blood glucoses is important for continued management of your diabetes. Please check your blood glucoses 2-4 times a day: fasting, before meals and at bedtime (you can rotate these measurements - e.g. one day check before the 3 meals, the next day check before 2 of the meals and before bedtime, etc.).   HYPOGLYCEMIA (low blood sugar) Hypoglycemia is usually a reaction to not eating, exercising, or taking too much insulin/ other diabetes drugs.  Symptoms include  tremors, sweating, hunger, confusion, headache, etc. Treat IMMEDIATELY with 15 grams of Carbs: . 4 glucose tablets .  cup regular juice/soda . 2 tablespoons raisins . 4 teaspoons sugar . 1 tablespoon honey Recheck blood glucose in 15 mins and repeat above if still symptomatic/blood glucose <100.  RECOMMENDATIONS TO REDUCE YOUR RISK OF DIABETIC COMPLICATIONS: * Take your prescribed MEDICATION(S) * Follow a DIABETIC diet: Complex carbs, fiber rich foods, (monounsaturated and polyunsaturated) fats * AVOID saturated/trans fats, high fat foods, >2,300 mg salt per day. * EXERCISE at least 5 times a week for 30 minutes or preferably daily.  * DO NOT SMOKE OR DRINK more than 1 drink a day. * Check your FEET every day. Do not wear tightfitting shoes. Contact us if you develop an ulcer * See your EYE doctor once a year or more if needed * Get a FLU shot once a year * Get a PNEUMONIA vaccine once before and once after age 31 years  GOALS:  * Your Hemoglobin A1c of <7%  * fasting sugars need to be <130 * after meals sugars need to be <180 (2h after you start eating) * Your Systolic BP should be 016 or lower  * Your Diastolic BP should be 80 or lower  * Your HDL (Good Cholesterol) should be 40 or higher  * Your LDL (Bad Cholesterol) should be 100 or lower. * Your Triglycerides should be 150 or lower  * Your Urine microalbumin (kidney function) should be <30 * Your Body Mass Index should  be 25 or lower    Please consider the following ways to cut down carbs and fat and increase fiber and micronutrients in your diet: - substitute whole grain for white bread or pasta - substitute brown rice for white rice - substitute 90-calorie flat bread pieces for slices of bread when possible - substitute sweet potatoes or yams for white potatoes - substitute humus for margarine - substitute tofu for cheese when possible - substitute almond or rice milk for regular milk (would not drink soy milk daily  due to concern for soy estrogen influence on breast cancer risk) - substitute dark chocolate for other sweets when possible - substitute water - can add lemon or orange slices for taste - for diet sodas (artificial sweeteners will trick your body that you can eat sweets without getting calories and will lead you to overeating and weight gain in the long run) - do not skip breakfast or other meals (this will slow down the metabolism and will result in more weight gain over time)  - can try smoothies made from fruit and almond/rice milk in am instead of regular breakfast - can also try old-fashioned (not instant) oatmeal made with almond/rice milk in am - order the dressing on the side when eating salad at a restaurant (pour less than half of the dressing on the salad) - eat as little meat as possible - can try juicing, but should not forget that juicing will get rid of the fiber, so would alternate with eating raw veg./fruits or drinking smoothies - use as little oil as possible, even when using olive oil - can dress a salad with a mix of balsamic vinegar and lemon juice, for e.g. - use agave nectar, stevia sugar, or regular sugar rather than artificial sweateners - steam or broil/roast veggies  - snack on veggies/fruit/nuts (unsalted, preferably) when possible, rather than processed foods - reduce or eliminate aspartame in diet (it is in diet sodas, chewing gum, etc) Read the labels!  Try to read Dr. Janene Harvey book: "Program for Reversing Diabetes" for other ideas for healthy eating.
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
## 2017-12-12 MED FILL — HYDROCHLOROTHIAZIDE 25 MG T: 25 | 30 days supply | Qty: 30 | Fill #5

## 2017-12-12 MED FILL — TELMISARTAN 80 MG TABS: 80 | 30 days supply | Qty: 30 | Fill #4

## 2017-12-12 MED FILL — ROSUVASTATIN CALCIUM 20 MG: 20 | 90 days supply | Qty: 90 | Fill #0

## 2017-12-12 MED FILL — UNIFINE PENTIPS 32GX5/32": 32G X 4 MM | 30 days supply | Qty: 30 | Fill #0

## 2017-12-12 MED FILL — UNIFINE PENTIPS 32GX5/32: 32G X 4 MM | 30 days supply | Qty: 30 | Fill #0

## 2017-12-13 ENCOUNTER — Encounter: Payer: Self-pay | Admitting: Gastroenterology

## 2017-12-13 ENCOUNTER — Ambulatory Visit (AMBULATORY_SURGERY_CENTER): Payer: Medicare PPO | Admitting: Gastroenterology

## 2017-12-13 VITALS — BP 127/69 | HR 73 | Temp 99.8°F | Resp 9 | Ht 66.5 in | Wt 246.0 lb

## 2017-12-13 DIAGNOSIS — I10 Essential (primary) hypertension: Secondary | ICD-10-CM | POA: Diagnosis not present

## 2017-12-13 DIAGNOSIS — B9681 Helicobacter pylori [H. pylori] as the cause of diseases classified elsewhere: Secondary | ICD-10-CM | POA: Diagnosis not present

## 2017-12-13 DIAGNOSIS — Z1211 Encounter for screening for malignant neoplasm of colon: Secondary | ICD-10-CM

## 2017-12-13 DIAGNOSIS — D123 Benign neoplasm of transverse colon: Secondary | ICD-10-CM | POA: Diagnosis not present

## 2017-12-13 DIAGNOSIS — D120 Benign neoplasm of cecum: Secondary | ICD-10-CM | POA: Diagnosis not present

## 2017-12-13 DIAGNOSIS — K2971 Gastritis, unspecified, with bleeding: Secondary | ICD-10-CM

## 2017-12-13 DIAGNOSIS — R1310 Dysphagia, unspecified: Secondary | ICD-10-CM

## 2017-12-13 DIAGNOSIS — K2950 Unspecified chronic gastritis without bleeding: Secondary | ICD-10-CM | POA: Diagnosis not present

## 2017-12-13 DIAGNOSIS — K5900 Constipation, unspecified: Secondary | ICD-10-CM

## 2017-12-13 DIAGNOSIS — E119 Type 2 diabetes mellitus without complications: Secondary | ICD-10-CM | POA: Diagnosis not present

## 2017-12-13 DIAGNOSIS — K219 Gastro-esophageal reflux disease without esophagitis: Secondary | ICD-10-CM | POA: Diagnosis not present

## 2017-12-13 MED ORDER — SODIUM CHLORIDE 0.9 % IV SOLN: 500.0000 mL | Freq: Once | INTRAVENOUS | Status: DC

## 2017-12-13 NOTE — Progress Notes (Signed)
Called to room to assist during endoscopic procedure.  Patient ID and intended procedure confirmed with present staff. Received instructions for my participation in the procedure from the performing physician.  

## 2017-12-13 NOTE — Op Note (Signed)
[place] Patient Name: Sarah Gallegos Procedure Date: 12/13/2017 3:22 PM MRN: [id number] Endoscopist: Mauri Pole , MD Age: 57 Referring MD:  Date of Birth: November 11, 1960 Gender: Female Account #: [id number] Procedure:                Upper GI endoscopy Indications:              Dysphagia, Epigastric abdominal pain Medicines:                Monitored Anesthesia Care Procedure:                Pre-Anesthesia Assessment:                           - Prior to the procedure, a History and Physical                            was performed, and patient medications and                            allergies were reviewed. The patient's tolerance of                            previous anesthesia was also reviewed. The risks                            and benefits of the procedure and the sedation                            options and risks were discussed with the patient.                            All questions were answered, and informed consent                            was obtained. Prior Anticoagulants: The patient has                            taken no previous anticoagulant or antiplatelet                            agents. ASA Grade Assessment: III - A patient with                            severe systemic disease. After reviewing the risks                            and benefits, the patient was deemed in                            satisfactory condition to undergo the procedure.                           After obtaining informed consent, the endoscope was  passed under direct vision. Throughout the                            procedure, the patient's blood pressure, pulse, and                            oxygen saturations were monitored continuously. The                            Endoscope was introduced through the mouth, and                            advanced to the second part of duodenum. The upper                            GI  endoscopy was accomplished without difficulty.                            The patient tolerated the procedure well. Scope In: Scope Out: Findings:                 No endoscopic abnormality was evident in the                            esophagus to explain the patient's complaint of                            dysphagia.                           The Z-line was regular and was found 35 cm from the                            incisors.                           Patchy mild inflammation characterized by                            congestion (edema), erythema and mucus was found in                            the entire examined stomach. Biopsies were taken                            with a cold forceps for Helicobacter pylori testing.                           The examined duodenum was normal. Complications:            No immediate complications. Estimated Blood Loss:     Estimated blood loss was minimal. Impression:               - No endoscopic esophageal abnormality to explain  patient's dysphagia.                           - Z-line regular, 35 cm from the incisors.                           - Gastritis. Biopsied.                           - Normal examined duodenum. Recommendation:           - Patient has a contact number available for                            emergencies. The signs and symptoms of potential                            delayed complications were discussed with the                            patient. Return to normal activities tomorrow.                            Written discharge instructions were provided to the                            patient.                           - Resume previous diet.                           - Continue present medications.                           - Await pathology results. Mauri Pole, MD 12/13/2017 3:58:07 PM This report has been signed electronically.

## 2017-12-13 NOTE — Progress Notes (Signed)
A/ox3 pleased with MAC, report to RN 

## 2017-12-13 NOTE — Patient Instructions (Signed)
Discharge instructions given. Handouts on polyps,hemorroids and Gastritis. Resume previous medications. YOU HAD AN ENDOSCOPIC PROCEDURE TODAY AT [facility]:   Refer to the procedure report that was given to you for any specific questions about what was found during the examination.  If the procedure report does not answer your questions, please call your gastroenterologist to clarify.  If you requested that your care partner not be given the details of your procedure findings, then the procedure report has been included in a sealed envelope for you to review at your convenience later.  YOU SHOULD EXPECT: Some feelings of bloating in the abdomen. Passage of more gas than usual.  Walking can help get rid of the air that was put into your GI tract during the procedure and reduce the bloating. If you had a lower endoscopy (such as a colonoscopy or flexible sigmoidoscopy) you may notice spotting of blood in your stool or on the toilet paper. If you underwent a bowel prep for your procedure, you may not have a normal bowel movement for a few days.  Please Note:  You might notice some irritation and congestion in your nose or some drainage.  This is from the oxygen used during your procedure.  There is no need for concern and it should clear up in a day or so.  SYMPTOMS TO REPORT IMMEDIATELY:   Following lower endoscopy (colonoscopy or flexible sigmoidoscopy):  Excessive amounts of blood in the stool  Significant tenderness or worsening of abdominal pains  Swelling of the abdomen that is new, acute  Fever of 100F or higher   Following upper endoscopy (EGD)  Vomiting of blood or coffee ground material  New chest pain or pain under the shoulder blades  Painful or persistently difficult swallowing  New shortness of breath  Fever of 100F or higher  Black, tarry-looking stools  For urgent or emergent issues, a gastroenterologist can be reached at any hour by calling [phone]  [phone].   DIET:  We do recommend a small meal at first, but then you may proceed to your regular diet.  Drink plenty of fluids but you should avoid alcoholic beverages for 24 hours.  ACTIVITY:  You should plan to take it easy for the rest of today and you should NOT DRIVE or use heavy machinery until tomorrow (because of the sedation medicines used during the test).    FOLLOW UP: Our staff will call the number listed on your records the next business day following your procedure to check on you and address any questions or concerns that you may have regarding the information given to you following your procedure. If we do not reach you, we will leave a message.  However, if you are feeling well and you are not experiencing any problems, there is no need to return our call.  We will assume that you have returned to your regular daily activities without incident.  If any biopsies were taken you will be contacted by phone or by letter within the next 1-3 weeks.  Please call us at [phone] if you have not heard about the biopsies in 3 weeks.    SIGNATURES/CONFIDENTIALITY: You and/or your care partner have signed paperwork which will be entered into your electronic medical record.  These signatures attest to the fact that that the information above on your After Visit Summary has been reviewed and is understood.  Full responsibility of the confidentiality of this discharge information lies with you and/or your care-partner.

## 2017-12-13 NOTE — Op Note (Signed)
[facility] Patient Name: Sarah Gallegos Procedure Date: 12/13/2017 3:21 PM MRN: [id number] Endoscopist: Mauri Pole , MD Age: 57 Referring MD:  Date of Birth: 10-May-1960 Gender: Female Account #: [id number] Procedure:                Colonoscopy Indications:              Screening for colorectal malignant neoplasm Medicines:                Monitored Anesthesia Care Procedure:                Pre-Anesthesia Assessment:                           - Prior to the procedure, a History and Physical                            was performed, and patient medications and                            allergies were reviewed. The patient's tolerance of                            previous anesthesia was also reviewed. The risks                            and benefits of the procedure and the sedation                            options and risks were discussed with the patient.                            All questions were answered, and informed consent                            was obtained. Prior Anticoagulants: The patient has                            taken no previous anticoagulant or antiplatelet                            agents. ASA Grade Assessment: III - A patient with                            severe systemic disease. After reviewing the risks                            and benefits, the patient was deemed in                            satisfactory condition to undergo the procedure.                           After obtaining informed consent, the colonoscope  was passed under direct vision. Throughout the                            procedure, the patient's blood pressure, pulse, and                            oxygen saturations were monitored continuously. The                            Model PCF-H190DL [id number]) scope was introduced                            through the anus and advanced to the the terminal   ileum, with identification of the appendiceal                            orifice and IC valve. The colonoscopy was performed                            without difficulty. The patient tolerated the                            procedure well. The quality of the bowel                            preparation was excellent. The ileocecal valve,                            appendiceal orifice, and rectum were photographed. Scope In: 3:33:33 PM Scope Out: 3:54:27 PM Scope Withdrawal Time: 0 hours 16 minutes 3 seconds  Total Procedure Duration: 0 hours 20 minutes 54 seconds  Findings:                 The perianal and digital rectal examinations were                            normal.                           The ileocecal valve was mildly lipomatous and                            protruding nodular mucosa. Biopsies were taken with                            a cold forceps for histology and exclude                            adenomatous tissue.                           A 5 mm polyp was found in the transverse colon. The                            polyp was sessile. The polyp was removed  with a                            cold snare. Resection and retrieval were complete.                           Non-bleeding internal hemorrhoids were found during                            retroflexion. The hemorrhoids were large. Complications:            No immediate complications. Estimated Blood Loss:     Estimated blood loss was minimal. Impression:               - Lipomatous ileocecal valve. Biopsied.                           - One 5 mm polyp in the transverse colon, removed                            with a cold snare. Resected and retrieved.                           - Non-bleeding internal hemorrhoids. Recommendation:           - Patient has a contact number available for                            emergencies. The signs and symptoms of potential                            delayed complications were  discussed with the                            patient. Return to normal activities tomorrow.                            Written discharge instructions were provided to the                            patient.                           - Resume previous diet.                           - Continue present medications.                           - Await pathology results.                           - Repeat colonoscopy in 5-10 years for surveillance                            based on pathology results. Mauri Pole, MD 12/13/2017 4:00:39 PM This report has been signed electronically.

## 2017-12-14 ENCOUNTER — Telehealth: Payer: Self-pay | Admitting: *Deleted

## 2017-12-14 ENCOUNTER — Telehealth: Payer: Self-pay

## 2017-12-14 MED FILL — POTASSIUM CL 10 MEQ TAB SA: 10 | 90 days supply | Qty: 90 | Fill #0

## 2017-12-14 NOTE — Telephone Encounter (Signed)
Left message on machine for follow up.

## 2017-12-14 NOTE — Telephone Encounter (Signed)
Message left

## 2017-12-19 ENCOUNTER — Encounter: Payer: Self-pay | Admitting: Internal Medicine

## 2017-12-19 DIAGNOSIS — F4320 Adjustment disorder, unspecified: Secondary | ICD-10-CM | POA: Insufficient documentation

## 2017-12-20 DIAGNOSIS — J309 Allergic rhinitis, unspecified: Secondary | ICD-10-CM | POA: Diagnosis not present

## 2017-12-20 DIAGNOSIS — F4320 Adjustment disorder, unspecified: Secondary | ICD-10-CM | POA: Diagnosis not present

## 2017-12-20 DIAGNOSIS — E1140 Type 2 diabetes mellitus with diabetic neuropathy, unspecified: Secondary | ICD-10-CM | POA: Diagnosis not present

## 2017-12-20 DIAGNOSIS — Z6841 Body Mass Index (BMI) 40.0 and over, adult: Secondary | ICD-10-CM | POA: Diagnosis not present

## 2017-12-21 MED FILL — TRESIBA FLEXTOUCH 200 UNITS: 200 | 41 days supply | Qty: 9 | Fill #1

## 2017-12-28 ENCOUNTER — Other Ambulatory Visit: Payer: Self-pay

## 2017-12-28 MED ORDER — OMEPRAZOLE 40 MG PO CPDR: DELAYED_RELEASE_CAPSULE | ORAL | 0 refills | Status: DC

## 2017-12-28 MED ORDER — BIS SUBCIT-METRONID-TETRACYC 140-125-125 MG PO CAPS: 3.0000 | ORAL_CAPSULE | Freq: Three times a day (TID) | ORAL | 0 refills | Status: DC

## 2017-12-28 MED FILL — PYLERA CAPSULE: 140-125-125 | 10 days supply | Qty: 120 | Fill #0

## 2017-12-29 ENCOUNTER — Other Ambulatory Visit: Payer: Self-pay

## 2017-12-29 DIAGNOSIS — K2971 Gastritis, unspecified, with bleeding: Secondary | ICD-10-CM

## 2017-12-29 DIAGNOSIS — A048 Other specified bacterial intestinal infections: Secondary | ICD-10-CM

## 2018-01-02 DIAGNOSIS — E1122 Type 2 diabetes mellitus with diabetic chronic kidney disease: Secondary | ICD-10-CM | POA: Diagnosis not present

## 2018-01-02 DIAGNOSIS — N179 Acute kidney failure, unspecified: Secondary | ICD-10-CM | POA: Diagnosis not present

## 2018-01-02 DIAGNOSIS — N183 Chronic kidney disease, stage 3 (moderate): Secondary | ICD-10-CM | POA: Diagnosis not present

## 2018-01-02 DIAGNOSIS — I129 Hypertensive chronic kidney disease with stage 1 through stage 4 chronic kidney disease, or unspecified chronic kidney disease: Secondary | ICD-10-CM | POA: Diagnosis not present

## 2018-01-12 ENCOUNTER — Ambulatory Visit: Payer: Medicare PPO | Admitting: Internal Medicine

## 2018-01-12 ENCOUNTER — Ambulatory Visit: Payer: Medicare PPO

## 2018-01-13 ENCOUNTER — Other Ambulatory Visit: Payer: Self-pay | Admitting: Internal Medicine

## 2018-01-13 MED FILL — GABAPENTIN 300 MG CAPSULE: 300 | 45 days supply | Qty: 180 | Fill #1

## 2018-01-13 MED FILL — CARVEDILOL 6.25 MG TABLET: 6.25 | 90 days supply | Qty: 180 | Fill #1

## 2018-01-13 MED FILL — FUROSEMIDE 40 MG TAB: 40 | 60 days supply | Qty: 120 | Fill #0

## 2018-01-16 MED FILL — HYDROCHLOROTHIAZIDE 25 MG T: 25 | 30 days supply | Qty: 30 | Fill #0

## 2018-01-19 DIAGNOSIS — E118 Type 2 diabetes mellitus with unspecified complications: Secondary | ICD-10-CM | POA: Diagnosis not present

## 2018-01-19 DIAGNOSIS — E1165 Type 2 diabetes mellitus with hyperglycemia: Secondary | ICD-10-CM | POA: Diagnosis not present

## 2018-01-19 DIAGNOSIS — R9439 Abnormal result of other cardiovascular function study: Secondary | ICD-10-CM | POA: Diagnosis not present

## 2018-01-19 DIAGNOSIS — I5030 Unspecified diastolic (congestive) heart failure: Secondary | ICD-10-CM | POA: Diagnosis not present

## 2018-01-19 DIAGNOSIS — I10 Essential (primary) hypertension: Secondary | ICD-10-CM | POA: Diagnosis not present

## 2018-01-19 MED FILL — TELMISARTAN 80 MG TABS: 80 | 30 days supply | Qty: 30 | Fill #5

## 2018-02-03 MED FILL — OZEMPIC 0.25 OR 0.5 MG/DOSE: 2 | 84 days supply | Qty: 5 | Fill #1

## 2018-02-03 MED FILL — TRESIBA FLEXTOUCH 200 UNITS: 200 | 41 days supply | Qty: 9 | Fill #2

## 2018-02-03 MED FILL — metFORMIN HCL 1000 MG TABS: 1000 | 90 days supply | Qty: 180 | Fill #1

## 2018-02-03 MED FILL — OMEPRAZOLE 40 MG CPDR: 40 | 90 days supply | Qty: 90 | Fill #2

## 2018-02-10 DIAGNOSIS — E113412 Type 2 diabetes mellitus with severe nonproliferative diabetic retinopathy with macular edema, left eye: Secondary | ICD-10-CM | POA: Diagnosis not present

## 2018-02-13 LAB — HM DIABETES EYE EXAM

## 2018-02-15 ENCOUNTER — Other Ambulatory Visit: Payer: Self-pay | Admitting: Internal Medicine

## 2018-02-15 MED FILL — TELMISARTAN 80 MG TABS: 80 | 30 days supply | Qty: 30 | Fill #0

## 2018-02-16 ENCOUNTER — Encounter: Payer: Self-pay | Admitting: Internal Medicine

## 2018-02-16 ENCOUNTER — Ambulatory Visit (INDEPENDENT_AMBULATORY_CARE_PROVIDER_SITE_OTHER): Payer: Medicare PPO

## 2018-02-16 ENCOUNTER — Ambulatory Visit (INDEPENDENT_AMBULATORY_CARE_PROVIDER_SITE_OTHER): Payer: Medicare PPO | Admitting: Internal Medicine

## 2018-02-16 VITALS — BP 118/76 | HR 85 | Temp 98.5°F | Ht 65.5 in | Wt 247.4 lb

## 2018-02-16 DIAGNOSIS — Z0000 Encounter for general adult medical examination without abnormal findings: Secondary | ICD-10-CM | POA: Diagnosis not present

## 2018-02-16 DIAGNOSIS — Z794 Long term (current) use of insulin: Secondary | ICD-10-CM

## 2018-02-16 DIAGNOSIS — N183 Chronic kidney disease, stage 3 unspecified: Secondary | ICD-10-CM

## 2018-02-16 DIAGNOSIS — G5712 Meralgia paresthetica, left lower limb: Secondary | ICD-10-CM | POA: Diagnosis not present

## 2018-02-16 DIAGNOSIS — Z7982 Long term (current) use of aspirin: Secondary | ICD-10-CM

## 2018-02-16 DIAGNOSIS — E039 Hypothyroidism, unspecified: Secondary | ICD-10-CM | POA: Diagnosis not present

## 2018-02-16 DIAGNOSIS — Z6841 Body Mass Index (BMI) 40.0 and over, adult: Secondary | ICD-10-CM

## 2018-02-16 DIAGNOSIS — E66813 Obesity, class 3: Secondary | ICD-10-CM

## 2018-02-16 DIAGNOSIS — I129 Hypertensive chronic kidney disease with stage 1 through stage 4 chronic kidney disease, or unspecified chronic kidney disease: Secondary | ICD-10-CM | POA: Diagnosis not present

## 2018-02-16 DIAGNOSIS — E1141 Type 2 diabetes mellitus with diabetic mononeuropathy: Secondary | ICD-10-CM | POA: Diagnosis not present

## 2018-02-16 LAB — POCT URINALYSIS DIPSTICK
Bilirubin, UA: NEGATIVE
Glucose, UA: POSITIVE — AB
Ketones, UA: NEGATIVE
LEUKOCYTES UA: NEGATIVE
Nitrite, UA: NEGATIVE
PH UA: 5.5 (ref 5.0–8.0)
PROTEIN UA: POSITIVE — AB
RBC UA: NEGATIVE
SPEC GRAV UA: 1.015 (ref 1.010–1.025)
UROBILINOGEN UA: 0.2 U/dL

## 2018-02-16 LAB — POCT UA - MICROALBUMIN
Albumin/Creatinine Ratio, Urine, POC: 300
Creatinine, POC: 200 mg/dL
MICROALBUMIN (UR) POC: 150 mg/L

## 2018-02-16 MED ORDER — LIDOCAINE 5 % EX PTCH: 1.0000 | MEDICATED_PATCH | CUTANEOUS | 2 refills | Status: AC

## 2018-02-16 MED FILL — LIDOCAINE PATCH 5%: 5 | 60 days supply | Qty: 60 | Fill #0

## 2018-02-16 NOTE — Patient Instructions (Signed)
Diabetes Mellitus and Exercise Exercising regularly is important for your overall health, especially when you have diabetes (diabetes mellitus). Exercising is not only about losing weight. It has many health benefits, such as increasing muscle strength and bone density and reducing body fat and stress. This leads to improved fitness, flexibility, and endurance, all of which result in better overall health. Exercise has additional benefits for people with diabetes, including:  Reducing appetite.  Helping to lower and control blood glucose.  Lowering blood pressure.  Helping to control amounts of fatty substances (lipids) in the blood, such as cholesterol and triglycerides.  Helping the body to respond better to insulin (improving insulin sensitivity).  Reducing how much insulin the body needs.  Decreasing the risk for heart disease by: ? Lowering cholesterol and triglyceride levels. ? Increasing the levels of good cholesterol. ? Lowering blood glucose levels.  What is my activity plan? Your health care provider or certified diabetes educator can help you make a plan for the type and frequency of exercise (activity plan) that works for you. Make sure that you:  Do at least 150 minutes of moderate-intensity or vigorous-intensity exercise each week. This could be brisk walking, biking, or water aerobics. ? Do stretching and strength exercises, such as yoga or weightlifting, at least 2 times a week. ? Spread out your activity over at least 3 days of the week.  Get some form of physical activity every day. ? Do not go more than 2 days in a row without some kind of physical activity. ? Avoid being inactive for more than 90 minutes at a time. Take frequent breaks to walk or stretch.  Choose a type of exercise or activity that you enjoy, and set realistic goals.  Start slowly, and gradually increase the intensity of your exercise over time.  What do I need to know about managing my  diabetes?  Check your blood glucose before and after exercising. ? If your blood glucose is higher than 240 mg/dL (13.3 mmol/L) before you exercise, check your urine for ketones. If you have ketones in your urine, do not exercise until your blood glucose returns to normal.  Know the symptoms of low blood glucose (hypoglycemia) and how to treat it. Your risk for hypoglycemia increases during and after exercise. Common symptoms of hypoglycemia can include: ? Hunger. ? Anxiety. ? Sweating and feeling clammy. ? Confusion. ? Dizziness or feeling light-headed. ? Increased heart rate or palpitations. ? Blurry vision. ? Tingling or numbness around the mouth, lips, or tongue. ? Tremors or shakes. ? Irritability.  Keep a rapid-acting carbohydrate snack available before, during, and after exercise to help prevent or treat hypoglycemia.  Avoid injecting insulin into areas of the body that are going to be exercised. For example, avoid injecting insulin into: ? The arms, when playing tennis. ? The legs, when jogging.  Keep records of your exercise habits. Doing this can help you and your health care provider adjust your diabetes management plan as needed. Write down: ? Food that you eat before and after you exercise. ? Blood glucose levels before and after you exercise. ? The type and amount of exercise you have done. ? When your insulin is expected to peak, if you use insulin. Avoid exercising at times when your insulin is peaking.  When you start a new exercise or activity, work with your health care provider to make sure the activity is safe for you, and to adjust your insulin, medicines, or food intake as needed.    Drink plenty of water while you exercise to prevent dehydration or heat stroke. Drink enough fluid to keep your urine clear or pale yellow. This information is not intended to replace advice given to you by your health care provider. Make sure you discuss any questions you have with  your health care provider. Document Released: 06/05/2003 Document Revised: 10/03/2015 Document Reviewed: 08/25/2015 Elsevier Interactive Patient Education  2018 [employer].   Obesity, Adult Obesity is having too much body fat. If you have a BMI of 30 or more, you are obese. BMI is a number that explains how much body fat you have. Obesity is often caused by taking in (consuming) more calories than your body uses. Obesity can cause serious health problems. Changing your lifestyle can help to treat obesity. Follow these instructions at home: Eating and drinking   Follow advice from your doctor about what to eat and drink. Your doctor may tell you to: ? Cut down on (limit) fast foods, sweets, and processed snack foods. ? Choose low-fat options. For example, choose low-fat milk instead of whole milk. ? Eat 5 or more servings of fruits or vegetables every day. ? Eat at home more often. This gives you more control over what you eat. ? Choose healthy foods when you eat out. ? Learn what a healthy portion size is. A portion size is the amount of a certain food that is healthy for you to eat at one time. This is different for each person. ? Keep low-fat snacks available. ? Avoid sugary drinks. These include soda, fruit juice, iced tea that is sweetened with sugar, and flavored milk. ? Eat a healthy breakfast.  Drink enough water to keep your pee (urine) clear or pale yellow.  Do not go without eating for long periods of time (do not fast).  Do not go on popular or trendy diets (fad diets). Physical Activity  Exercise often, as told by your doctor. Ask your doctor: ? What types of exercise are safe for you. ? How often you should exercise.  Warm up and stretch before being active.  Do slow stretching after being active (cool down).  Rest between times of being active. Lifestyle  Limit how much time you spend in front of your TV, computer, or video game system (be less  sedentary).  Find ways to reward yourself that do not involve food.  Limit alcohol intake to no more than 1 drink a day for nonpregnant women and 2 drinks a day for men. One drink equals 12 oz of beer, 5 oz of wine, or 1 oz of hard liquor. General instructions  Keep a weight loss journal. This can help you keep track of: ? The food that you eat. ? The exercise that you do.  Take over-the-counter and prescription medicines only as told by your doctor.  Take vitamins and supplements only as told by your doctor.  Think about joining a support group. Your doctor may be able to help with this.  Keep all follow-up visits as told by your doctor. This is important. Contact a doctor if:  You cannot meet your weight loss goal after you have changed your diet and lifestyle for 6 weeks. This information is not intended to replace advice given to you by your health care provider. Make sure you discuss any questions you have with your health care provider. Document Released: 06/07/2011 Document Revised: 08/21/2015 Document Reviewed: 01/01/2015 Elsevier Interactive Patient Education  2018 [employer].

## 2018-02-16 NOTE — Patient Instructions (Signed)
Sarah Gallegos , Thank you for taking time to come for your Medicare Wellness Visit. I appreciate your ongoing commitment to your health goals. Please review the following plan we discussed and let me know if I can assist you in the future.   Screening recommendations/referrals: Colonoscopy: 11/2017 Mammogram: 03/2017 Bone Density: 01/2016 Recommended yearly ophthalmology/optometry visit for glaucoma screening and checkup Recommended yearly dental visit for hygiene and checkup  Vaccinations: Influenza vaccine: declines Pneumococcal vaccine: declines Tdap vaccine: 11/2017 Shingles vaccine: declines    Advanced directives: Advance directive discussed with you today. I have provided a copy for you to complete at home and have notarized. Once this is complete please bring a copy in to our office so we can scan it into your chart.   Conditions/risks identified: Obesity: Patient states she wants to eat healthier and attends exercise class three times a week.  Next appointment: 03/30/2018 at 10:15  Preventive Care 40-64 Years, Female Preventive care refers to lifestyle choices and visits with your health care provider that can promote health and wellness. What does preventive care include?  A yearly physical exam. This is also called an annual well check.  Dental exams once or twice a year.  Routine eye exams. Ask your health care provider how often you should have your eyes checked.  Personal lifestyle choices, including:  Daily care of your teeth and gums.  Regular physical activity.  Eating a healthy diet.  Avoiding tobacco and drug use.  Limiting alcohol use.  Practicing safe sex.  Taking low-dose aspirin daily starting at age 26.  Taking vitamin and mineral supplements as recommended by your health care provider. What happens during an annual well check? The services and screenings done by your health care provider during your annual well check will depend on your age, overall  health, lifestyle risk factors, and family history of disease. Counseling  Your health care provider may ask you questions about your:  Alcohol use.  Tobacco use.  Drug use.  Emotional well-being.  Home and relationship well-being.  Sexual activity.  Eating habits.  Work and work Statistician.  Method of birth control.  Menstrual cycle.  Pregnancy history. Screening  You may have the following tests or measurements:  Height, weight, and BMI.  Blood pressure.  Lipid and cholesterol levels. These may be checked every 5 years, or more frequently if you are over 50 years old.  Skin check.  Lung cancer screening. You may have this screening every year starting at age 34 if you have a 30-pack-year history of smoking and currently smoke or have quit within the past 15 years.  Fecal occult blood test (FOBT) of the stool. You may have this test every year starting at age 9.  Flexible sigmoidoscopy or colonoscopy. You may have a sigmoidoscopy every 5 years or a colonoscopy every 10 years starting at age 64.  Hepatitis C blood test.  Hepatitis B blood test.  Sexually transmitted disease (STD) testing.  Diabetes screening. This is done by checking your blood sugar (glucose) after you have not eaten for a while (fasting). You may have this done every 1-3 years.  Mammogram. This may be done every 1-2 years. Talk to your health care provider about when you should start having regular mammograms. This may depend on whether you have a family history of breast cancer.  BRCA-related cancer screening. This may be done if you have a family history of breast, ovarian, tubal, or peritoneal cancers.  Pelvic exam and Pap test. This  may be done every 3 years starting at age 74. Starting at age 2, this may be done every 5 years if you have a Pap test in combination with an HPV test.  Bone density scan. This is done to screen for osteoporosis. You may have this scan if you are at high  risk for osteoporosis. Discuss your test results, treatment options, and if necessary, the need for more tests with your health care provider. Vaccines  Your health care provider may recommend certain vaccines, such as:  Influenza vaccine. This is recommended every year.  Tetanus, diphtheria, and acellular pertussis (Tdap, Td) vaccine. You may need a Td booster every 10 years.  Zoster vaccine. You may need this after age 86.  Pneumococcal 13-valent conjugate (PCV13) vaccine. You may need this if you have certain conditions and were not previously vaccinated.  Pneumococcal polysaccharide (PPSV23) vaccine. You may need one or two doses if you smoke cigarettes or if you have certain conditions. Talk to your health care provider about which screenings and vaccines you need and how often you need them. This information is not intended to replace advice given to you by your health care provider. Make sure you discuss any questions you have with your health care provider. Document Released: 04/11/2015 Document Revised: 12/03/2015 Document Reviewed: 01/14/2015 Elsevier Interactive Patient Education  2017 [place] Prevention in the Home Falls can cause injuries. They can happen to people of all ages. There are many things you can do to make your home safe and to help prevent falls. What can I do on the outside of my home?  Regularly fix the edges of walkways and driveways and fix any cracks.  Remove anything that might make you trip as you walk through a door, such as a raised step or threshold.  Trim any bushes or trees on the path to your home.  Use bright outdoor lighting.  Clear any walking paths of anything that might make someone trip, such as rocks or tools.  Regularly check to see if handrails are loose or broken. Make sure that both sides of any steps have handrails.  Any raised decks and porches should have guardrails on the edges.  Have any leaves, snow, or ice  cleared regularly.  Use sand or salt on walking paths during winter.  Clean up any spills in your garage right away. This includes oil or grease spills. What can I do in the bathroom?  Use night lights.  Install grab bars by the toilet and in the tub and shower. Do not use towel bars as grab bars.  Use non-skid mats or decals in the tub or shower.  If you need to sit down in the shower, use a plastic, non-slip stool.  Keep the floor dry. Clean up any water that spills on the floor as soon as it happens.  Remove soap buildup in the tub or shower regularly.  Attach bath mats securely with double-sided non-slip rug tape.  Do not have throw rugs and other things on the floor that can make you trip. What can I do in the bedroom?  Use night lights.  Make sure that you have a light by your bed that is easy to reach.  Do not use any sheets or blankets that are too big for your bed. They should not hang down onto the floor.  Have a firm chair that has side arms. You can use this for support while you get dressed.  Do not have throw rugs and other things on the floor that can make you trip. What can I do in the kitchen?  Clean up any spills right away.  Avoid walking on wet floors.  Keep items that you use a lot in easy-to-reach places.  If you need to reach something above you, use a strong step stool that has a grab bar.  Keep electrical cords out of the way.  Do not use floor polish or wax that makes floors slippery. If you must use wax, use non-skid floor wax.  Do not have throw rugs and other things on the floor that can make you trip. What can I do with my stairs?  Do not leave any items on the stairs.  Make sure that there are handrails on both sides of the stairs and use them. Fix handrails that are broken or loose. Make sure that handrails are as long as the stairways.  Check any carpeting to make sure that it is firmly attached to the stairs. Fix any carpet that  is loose or worn.  Avoid having throw rugs at the top or bottom of the stairs. If you do have throw rugs, attach them to the floor with carpet tape.  Make sure that you have a light switch at the top of the stairs and the bottom of the stairs. If you do not have them, ask someone to add them for you. What else can I do to help prevent falls?  Wear shoes that:  Do not have high heels.  Have rubber bottoms.  Are comfortable and fit you well.  Are closed at the toe. Do not wear sandals.  If you use a stepladder:  Make sure that it is fully opened. Do not climb a closed stepladder.  Make sure that both sides of the stepladder are locked into place.  Ask someone to hold it for you, if possible.  Clearly mark and make sure that you can see:  Any grab bars or handrails.  First and last steps.  Where the edge of each step is.  Use tools that help you move around (mobility aids) if they are needed. These include:  Canes.  Walkers.  Scooters.  Crutches.  Turn on the lights when you go into a dark area. Replace any light bulbs as soon as they burn out.  Set up your furniture so you have a clear path. Avoid moving your furniture around.  If any of your floors are uneven, fix them.  If there are any pets around you, be aware of where they are.  Review your medicines with your doctor. Some medicines can make you feel dizzy. This can increase your chance of falling. Ask your doctor what other things that you can do to help prevent falls. This information is not intended to replace advice given to you by your health care provider. Make sure you discuss any questions you have with your health care provider. Document Released: 01/09/2009 Document Revised: 08/21/2015 Document Reviewed: 04/19/2014 Elsevier Interactive Patient Education  2017 [employer].

## 2018-02-16 NOTE — Progress Notes (Signed)
Subjective:   Sarah Gallegos is a 57 y.o. female who presents for Medicare Annual (Subsequent) preventive examination.  Review of Systems:  n/a Cardiac Risk Factors include: diabetes mellitus;hypertension;obesity (BMI >30kg/m2)     Objective:     Vitals: BP 118/76 (BP Location: Left Arm, Patient Position: Sitting)   Pulse 85   Temp 98.5 F (36.9 C) (Oral)   Ht 5' 5.5" (1.664 m)   Wt 247 lb 6.4 oz (112.2 kg)   SpO2 95%   BMI 40.54 kg/m   Body mass index is 40.54 kg/m.  Advanced Directives 02/16/2018 03/31/2017 07/27/2015 07/31/2014  Does Patient Have a Medical Advance Directive? No No No No  Would patient like information on creating a medical advance directive? Yes (MAU/Ambulatory/Procedural Areas - Information given) - - No - patient declined information    Tobacco Social History   Tobacco Use  Smoking Status Former Smoker  . Packs/day: 1.00  . Years: 7.00  . Pack years: 7.00  . Types: Cigarettes  Smokeless Tobacco Never Used  Tobacco Comment   quit in 1987     Counseling given: Not Answered Comment: quit in 1987   Clinical Intake:  Pre-visit preparation completed: Yes  Pain : 0-10 Pain Score: 9  Pain Type: Neuropathic pain Pain Location: Other (Comment)(left thigh) Pain Orientation: Left Pain Descriptors / Indicators: Throbbing, Burning Pain Onset: 1 to 4 weeks ago Pain Frequency: Constant Pain Relieving Factors: nothing helps Effect of Pain on Daily Activities: hurts more at night  Pain Relieving Factors: nothing helps  Nutritional Status: BMI > 30  Obese Nutritional Risks: Nausea/ vomitting/ diarrhea(on off had colonoscopy) Diabetes: Yes CBG done?: No Did pt. bring in CBG monitor from home?: No  How often do you need to have someone help you when you read instructions, pamphlets, or other written materials from your doctor or pharmacy?: 1 - Never What is the last grade level you completed in school?: some college  Interpreter Needed?:  No  Information entered by :: NAllen LPN  Past Medical History:  Diagnosis Date  . Diabetes mellitus without complication ([place])   . Hypercholesteremia   . Hypertension   . Hypothyroidism   . Neuropathy    Past Surgical History:  Procedure Laterality Date  . ABDOMINAL HYSTERECTOMY     2008  . BACK SURGERY     2003, [facility], [place], [place]  . BREAST BIOPSY     2003, [facility], [place], [place] SURGERY     2010,  for graves disease. [place], [place]     2003, [facility], [place],    Family History  Problem Relation Age of Onset  . Heart failure Mother   . Stroke Mother   . Hypertension Father   . Colon cancer Neg Hx   . Esophageal cancer Neg Hx   . Stomach cancer Neg Hx    Social History   Socioeconomic History  . Marital status: Married    Spouse name: Not on file  . Number of children: Not on file  . Years of education: Not on file  . Highest education level: Not on file  Occupational History  . Occupation: unemployed  Social Needs  . Financial resource strain: Not hard at all  . Food insecurity:    Worry: Never true    Inability: Never true  . Transportation needs:    Medical: No    Non-medical: No  Tobacco Use  . Smoking status:  Former Smoker    Packs/day: 1.00    Years: 7.00    Pack years: 7.00    Types: Cigarettes  . Smokeless tobacco: Never Used  . Tobacco comment: quit in 1987  Substance and Sexual Activity  . Alcohol use: No    Alcohol/week: 0.0 standard drinks  . Drug use: No  . Sexual activity: Not Currently  Lifestyle  . Physical activity:    Days per week: 3 days    Minutes per session: 50 min  . Stress: Very much  Relationships  . Social connections:    Talks on phone: Not on file    Gets together: Not on file    Attends religious service: Not on file    Active member of club or organization: Not on file    Attends meetings of clubs or organizations: Not on file    Relationship  status: Not on file  Other Topics Concern  . Not on file  Social History Narrative  . Not on file    Outpatient Encounter Medications as of 02/16/2018  Medication Sig  . aspirin 81 MG chewable tablet Chew 81 mg by mouth daily.  . carvedilol (COREG) 6.25 MG tablet Take 6.25 mg by mouth 2 (two) times daily with a meal.  . Cholecalciferol (VITAMIN D-3) 5000 units TABS Take 5,000 Units by mouth daily.  . dapagliflozin propanediol (FARXIGA) 10 MG TABS tablet Take 10 mg by mouth daily.  . furosemide (LASIX) 40 MG tablet Take 40 mg by mouth 2 (two) times daily.  Marland Kitchen gabapentin (NEURONTIN) 300 MG capsule Take 600 mg by mouth 2 (two) times daily.   . hydrochlorothiazide (HYDRODIURIL) 25 MG tablet TAKE 1 TABLET BY MOUTH DAILY  . insulin aspart (NOVOLOG FLEXPEN) 100 UNIT/ML FlexPen Inject 15-25 Units into the skin 3 (three) times daily with meals.  . Insulin Degludec (TRESIBA FLEXTOUCH) 200 UNIT/ML SOPN Inject 43 Units into the skin at bedtime.  Marland Kitchen levothyroxine (SYNTHROID, LEVOTHROID) 200 MCG tablet Take 200 mcg by mouth daily before breakfast.  . linaclotide (LINZESS) 145 MCG CAPS capsule Take 1 capsule (145 mcg total) by mouth daily before breakfast.  . MELATONIN PO Take 1 tablet by mouth daily.  . metFORMIN (GLUCOPHAGE) 1000 MG tablet Take 1,000 mg by mouth 2 (two) times daily with a meal.  . omeprazole (PRILOSEC) 40 MG capsule Twice daily with Pylera as directed  . potassium chloride (K-DUR,KLOR-CON) 10 MEQ tablet Take 10 mEq by mouth 2 (two) times daily.  . rosuvastatin (CRESTOR) 20 MG tablet Take 20 mg by mouth daily.  . Semaglutide (OZEMPIC) 0.25 or 0.5 MG/DOSE SOPN Inject 0.5 mg into the skin once a week.  . telmisartan (MICARDIS) 80 MG tablet TAKE 1 TABLET BY MOUTH DAILY  . bismuth-metronidazole-tetracycline (PYLERA) 140-125-125 MG capsule Take 3 capsules by mouth 4 (four) times daily -  before meals and at bedtime. (Patient not taking: Reported on 02/16/2018)  . losartan-hydrochlorothiazide  (HYZAAR) 100-25 MG tablet Take 1 tablet by mouth daily.   No facility-administered encounter medications on file as of 02/16/2018.     Activities of Daily Living In your present state of health, do you have any difficulty performing the following activities: 02/16/2018  Hearing? N  Vision? Y  Comment eyes running, seeing eye doctor in regards  Difficulty concentrating or making decisions? N  Walking or climbing stairs? Y  Comment thigh pain, uses cane some times  Dressing or bathing? N  Doing errands, shopping? N  Preparing Food and eating ? N  Using the Toilet? N  In the past six months, have you accidently leaked urine? N  Do you have problems with loss of bowel control? N  Managing your Medications? N  Managing your Finances? N  Housekeeping or managing your Housekeeping? N  Some recent data might be hidden    Patient Care Team: Glendale Chard, MD as PCP - General (Internal Medicine)    Assessment:   This is a routine wellness examination for Araina.  Exercise Activities and Dietary recommendations Current Exercise Habits: Structured exercise class, Type of exercise: calisthenics, Time (Minutes): 45, Frequency (Times/Week): 3, Weekly Exercise (Minutes/Week): 135, Intensity: Moderate, Exercise limited by: None identified  Goals    . Trying to eat healthier (pt-stated)       Fall Risk Fall Risk  02/16/2018  Falls in the past year? 0  Risk for fall due to : Medication side effect  Follow up Falls prevention discussed   Is the patient's home free of loose throw rugs in walkways, pet beds, electrical cords, etc?   yes      Grab bars in the bathroom? yes      Handrails on the stairs?   no      Adequate lighting?   yes  Timed Get Up and Go performed: n/a  Depression Screen PHQ 2/9 Scores 02/16/2018  PHQ - 2 Score 1  PHQ- 9 Score 6     Cognitive Function     6CIT Screen 02/16/2018  What Year? 0 points  What month? 0 points  What time? 0 points  Count  back from 20 0 points  Months in reverse 0 points  Repeat phrase 0 points  Total Score 0     There is no immunization history on file for this patient.  Qualifies for Shingles Vaccine? yes  Screening Tests Health Maintenance  Topic Date Due  . Hepatitis C Screening  [birth year]  . PNEUMOCOCCAL POLYSACCHARIDE VACCINE AGE 68-64 HIGH RISK  06/16/1963  . FOOT EXAM  06/16/1971  . HIV Screening  06/15/1976  . TETANUS/TDAP  06/15/1980  . PAP SMEAR  06/16/1982  . INFLUENZA VACCINE  10/27/2017  . HEMOGLOBIN A1C  06/08/2018  . OPHTHALMOLOGY EXAM  02/14/2019  . MAMMOGRAM  04/13/2019  . COLONOSCOPY  12/14/2022    Cancer Screenings: Lung: Low Dose CT Chest recommended if Age 65-80 years, 30 pack-year currently smoking OR have quit w/in 15years. Patient does not qualify. Breast:  Up to date on Mammogram? Yes   Up to date of Bone Density/Dexa? Yes Colorectal: up to date  Additional Screenings: : Hepatitis C Screening: due     Plan:    Patient declines immunizations. States she wants to start eating healthier.   I have personally reviewed and noted the following in the patient's chart:   . Medical and social history . Use of alcohol, tobacco or illicit drugs  . Current medications and supplements . Functional ability and status . Nutritional status . Physical activity . Advanced directives . List of other physicians . Hospitalizations, surgeries, and ER visits in previous 12 months . Vitals . Screenings to include cognitive, depression, and falls . Referrals and appointments  In addition, I have reviewed and discussed with patient certain preventive protocols, quality metrics, and best practice recommendations. A written personalized care plan for preventive services as well as general preventive health recommendations were provided to patient.     Kellie Simmering, LPN  [id number]

## 2018-02-17 DIAGNOSIS — E113513 Type 2 diabetes mellitus with proliferative diabetic retinopathy with macular edema, bilateral: Secondary | ICD-10-CM | POA: Diagnosis not present

## 2018-02-17 DIAGNOSIS — E113512 Type 2 diabetes mellitus with proliferative diabetic retinopathy with macular edema, left eye: Secondary | ICD-10-CM | POA: Diagnosis not present

## 2018-02-17 DIAGNOSIS — H43813 Vitreous degeneration, bilateral: Secondary | ICD-10-CM | POA: Diagnosis not present

## 2018-02-17 DIAGNOSIS — H4312 Vitreous hemorrhage, left eye: Secondary | ICD-10-CM | POA: Diagnosis not present

## 2018-02-17 LAB — BMP8+EGFR
BUN/Creatinine Ratio: 28 — ABNORMAL HIGH (ref 9–23)
BUN: 49 mg/dL — AB (ref 6–24)
CALCIUM: 10.1 mg/dL (ref 8.7–10.2)
CO2: 25 mmol/L (ref 20–29)
CREATININE: 1.76 mg/dL — AB (ref 0.57–1.00)
Chloride: 94 mmol/L — ABNORMAL LOW (ref 96–106)
GFR calc Af Amer: 37 mL/min/{1.73_m2} — ABNORMAL LOW (ref >59)
GFR calc non Af Amer: 32 mL/min/{1.73_m2} — ABNORMAL LOW (ref >59)
GLUCOSE: 125 mg/dL — AB (ref 65–99)
Potassium: 4.3 mmol/L (ref 3.5–5.2)
Sodium: 138 mmol/L (ref 134–144)

## 2018-02-17 LAB — TSH: TSH: 0.393 u[IU]/mL — AB (ref 0.450–4.500)

## 2018-02-17 LAB — HEMOGLOBIN A1C
ESTIMATED AVERAGE GLUCOSE: 255 mg/dL
HEMOGLOBIN A1C: 10.5 % — AB (ref 4.8–5.6)

## 2018-02-17 LAB — T4, FREE: FREE T4: 1.98 ng/dL — AB (ref 0.82–1.77)

## 2018-02-19 ENCOUNTER — Encounter: Payer: Self-pay | Admitting: Internal Medicine

## 2018-02-19 NOTE — Progress Notes (Addendum)
Subjective:     Patient ID: Sarah Gallegos , female    DOB: 03-19-1961 , 57 y.o.   MRN: [id number]   Chief Complaint  Patient presents with  . Diabetes  . Hypertension    HPI  Diabetes  She presents for her follow-up diabetic visit. She has type 2 diabetes mellitus. Her disease course has been improving. There are no hypoglycemic associated symptoms. Pertinent negatives for hypoglycemia include no headaches. There are no diabetic associated symptoms. Pertinent negatives for diabetes include no blurred vision and no chest pain. There are no hypoglycemic complications. Symptoms are improving. Diabetic complications include nephropathy. Risk factors for coronary artery disease include diabetes mellitus, dyslipidemia, hypertension, obesity, sedentary lifestyle and post-menopausal. She participates in exercise intermittently.  Hypertension  This is a chronic problem. The current episode started more than 1 year ago. The problem has been gradually improving since onset. The problem is controlled. Pertinent negatives include no blurred vision, chest pain or headaches.     Past Medical History:  Diagnosis Date  . Diabetes mellitus without complication ([place])   . Hypercholesteremia   . Hypertension   . Hypothyroidism   . Neuropathy      Family History  Problem Relation Age of Onset  . Heart failure Mother   . Stroke Mother   . Hypertension Father   . Colon cancer Neg Hx   . Esophageal cancer Neg Hx   . Stomach cancer Neg Hx      Current Outpatient Medications:  .  aspirin 81 MG chewable tablet, Chew 81 mg by mouth daily., Disp: , Rfl:  .  bismuth-metronidazole-tetracycline (PYLERA) 140-125-125 MG capsule, Take 3 capsules by mouth 4 (four) times daily -  before meals and at bedtime. (Patient not taking: Reported on 02/16/2018), Disp: 120 capsule, Rfl: 0 .  carvedilol (COREG) 6.25 MG tablet, Take 6.25 mg by mouth 2 (two) times daily with a meal., Disp: , Rfl:  .  Cholecalciferol  (VITAMIN D-3) 5000 units TABS, Take 5,000 Units by mouth daily., Disp: , Rfl:  .  dapagliflozin propanediol (FARXIGA) 10 MG TABS tablet, Take 10 mg by mouth daily., Disp: , Rfl:  .  furosemide (LASIX) 40 MG tablet, Take 40 mg by mouth 2 (two) times daily., Disp: , Rfl:  .  gabapentin (NEURONTIN) 300 MG capsule, Take 600 mg by mouth 2 (two) times daily. , Disp: , Rfl:  .  hydrochlorothiazide (HYDRODIURIL) 25 MG tablet, TAKE 1 TABLET BY MOUTH DAILY, Disp: 30 tablet, Rfl: 5 .  insulin aspart (NOVOLOG FLEXPEN) 100 UNIT/ML FlexPen, Inject 15-25 Units into the skin 3 (three) times daily with meals., Disp: 15 mL, Rfl: 11 .  Insulin Degludec (TRESIBA FLEXTOUCH) 200 UNIT/ML SOPN, Inject 43 Units into the skin at bedtime., Disp: , Rfl:  .  levothyroxine (SYNTHROID, LEVOTHROID) 200 MCG tablet, Take 200 mcg by mouth daily before breakfast., Disp: , Rfl:  .  lidocaine (LIDODERM) 5 %, Place 1 patch onto the skin daily. Remove & Discard patch within 12 hours or as directed by MD, Disp: 60 patch, Rfl: 2 .  linaclotide (LINZESS) 145 MCG CAPS capsule, Take 1 capsule (145 mcg total) by mouth daily before breakfast., Disp: 30 capsule, Rfl: 3 .  losartan-hydrochlorothiazide (HYZAAR) 100-25 MG tablet, Take 1 tablet by mouth daily., Disp: , Rfl:  .  MELATONIN PO, Take 1 tablet by mouth daily., Disp: , Rfl:  .  metFORMIN (GLUCOPHAGE) 1000 MG tablet, Take 1,000 mg by mouth 2 (two) times daily with a meal.,  Disp: , Rfl:  .  omeprazole (PRILOSEC) 40 MG capsule, Twice daily with Pylera as directed, Disp: 14 capsule, Rfl: 0 .  potassium chloride (K-DUR,KLOR-CON) 10 MEQ tablet, Take 10 mEq by mouth 2 (two) times daily., Disp: , Rfl:  .  rosuvastatin (CRESTOR) 20 MG tablet, Take 20 mg by mouth daily., Disp: , Rfl:  .  Semaglutide (OZEMPIC) 0.25 or 0.5 MG/DOSE SOPN, Inject 0.5 mg into the skin once a week., Disp: , Rfl:  .  telmisartan (MICARDIS) 80 MG tablet, TAKE 1 TABLET BY MOUTH DAILY, Disp: 30 tablet, Rfl: 5   Allergies   Allergen Reactions  . Lantus [Insulin Glargine] Rash and Other (See Comments)    Face breaks out really bad  . Tradjenta [Linagliptin] Rash and Other (See Comments)    Face breaks out     Review of Systems  Constitutional: Negative.   Eyes: Negative for blurred vision.  Respiratory: Negative.   Cardiovascular: Negative.  Negative for chest pain.  Gastrointestinal: Negative.   Neurological: Positive for numbness (SHE C/O NUMBNESS ON LATERAL SIDE OF L THIGH. DENIES FALL/TRAUMA. UNABLE TO DETERMINE WHAT TRIGGERED HER SX. SHE DENIES LLE WEAKNESS. ). Negative for headaches.  Psychiatric/Behavioral: Negative.      Today's Vitals   02/16/18 1130  BP: 118/76  Pulse: 85  Temp: 98.5 F (36.9 C)  TempSrc: Oral  Weight: 247 lb 6.4 oz (112.2 kg)  Height: 5' 5.5" (1.664 m)  PainSc: 10-Worst pain ever  PainLoc: Leg   Body mass index is 40.54 kg/m.   Objective:  Physical Exam  Constitutional: She is oriented to person, place, and time. She appears well-developed and well-nourished.  HENT:  Head: Normocephalic and atraumatic.  Eyes: EOM are normal.  Cardiovascular: Normal rate, regular rhythm and normal heart sounds.  Pulmonary/Chest: Effort normal and breath sounds normal.  Neurological: She is alert and oriented to person, place, and time.  Skin: Skin is warm.  Psychiatric: She has a normal mood and affect.  Nursing note and vitals reviewed.       Assessment And Plan:     1. Diabetic mononeuropathy associated with type 2 diabetes mellitus ([place])  I will check labs as listed below. She is encouraged to start a regular exercise regimen. She is encouraged to exercise no less than 30 minutes four to five days weekly.   - BMP8+EGFR - Hemoglobin A1c - POCT Urinalysis Dipstick (81002) - POCT UA - Microalbumin  2. Hypertensive nephropathy  Well controlled. She will continue with current meds. She is encouraged to avoid adding salt to her foods.   3. Chronic renal disease,  stage III (HCC)  Chronic. Importance of optimal BP/BS control was discussed with the patient.   4. Primary hypothyroidism  I will check a thyroid panel and adjust meds as needed.  - TSH - T4, Free  5. Meralgia paraesthetica, left  She is advised to try otc lidocaine patch on affected area. She is encouraged to use as directed on the packaging.  She will let me know if this is helpful.  6. Class 3 severe obesity due to excess calories with serious comorbidity and body mass index (BMI) of 40.0 to 44.9 in adult [facility])  She is encouraged to strive for BMI less than 37 to decrease cardiac risk. She does report understanding that this is our initial goal. She is encouraged to avoid sugary beverages.   Maximino Greenland, MD

## 2018-02-20 ENCOUNTER — Other Ambulatory Visit: Payer: Self-pay | Admitting: Internal Medicine

## 2018-02-20 MED FILL — HYDROCHLOROTHIAZIDE 25 MG T: 25 | 30 days supply | Qty: 30 | Fill #1

## 2018-02-20 MED FILL — SYNTHROID 200 MCG TABLET: 200 | 90 days supply | Qty: 90 | Fill #0

## 2018-02-22 ENCOUNTER — Other Ambulatory Visit: Payer: Self-pay

## 2018-02-22 ENCOUNTER — Telehealth: Payer: Self-pay

## 2018-02-22 ENCOUNTER — Telehealth: Payer: Self-pay | Admitting: Internal Medicine

## 2018-02-22 NOTE — Telephone Encounter (Signed)
The pt said that she goes and sees the heart dr in Jan and yes she takes 2 lasix pills per day.
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
## 2018-02-22 NOTE — Progress Notes (Signed)
Do we have 19mcg pills? If yes, have her take 153mcg M-F and 234mcg on Sat/Sun.

## 2018-02-27 NOTE — Progress Notes (Signed)
pls read original message. It states 115mcg M-F and 216mcg on Sat/Sun.

## 2018-03-13 MED FILL — POTASSIUM CHLORIDE CRYS ER: 10 | 90 days supply | Qty: 90 | Fill #1

## 2018-03-13 MED FILL — ROSUVASTATIN CALCIUM 20 MG: 20 | 90 days supply | Qty: 90 | Fill #1

## 2018-03-13 MED FILL — FUROSEMIDE 40 MG TAB: 40 | 60 days supply | Qty: 120 | Fill #1

## 2018-03-15 DIAGNOSIS — E113513 Type 2 diabetes mellitus with proliferative diabetic retinopathy with macular edema, bilateral: Secondary | ICD-10-CM | POA: Diagnosis not present

## 2018-03-15 DIAGNOSIS — H43813 Vitreous degeneration, bilateral: Secondary | ICD-10-CM | POA: Diagnosis not present

## 2018-03-15 DIAGNOSIS — E113511 Type 2 diabetes mellitus with proliferative diabetic retinopathy with macular edema, right eye: Secondary | ICD-10-CM | POA: Diagnosis not present

## 2018-03-20 MED FILL — HYDROCHLOROTHIAZIDE 25 MG T: 25 | 30 days supply | Qty: 30 | Fill #2

## 2018-03-24 ENCOUNTER — Ambulatory Visit: Payer: Medicare PPO | Admitting: Internal Medicine

## 2018-03-28 MED FILL — NOVOLOG FLEXPEN SYRINGE: 100 | 20 days supply | Qty: 15 | Fill #1

## 2018-03-30 ENCOUNTER — Ambulatory Visit: Payer: Medicare PPO | Admitting: Internal Medicine

## 2018-04-19 ENCOUNTER — Encounter: Payer: Self-pay | Admitting: Internal Medicine

## 2018-04-19 ENCOUNTER — Ambulatory Visit (INDEPENDENT_AMBULATORY_CARE_PROVIDER_SITE_OTHER): Payer: Medicare PPO | Admitting: Internal Medicine

## 2018-04-19 VITALS — BP 114/70 | HR 72 | Temp 98.1°F | Ht 64.0 in | Wt 245.2 lb

## 2018-04-19 DIAGNOSIS — E039 Hypothyroidism, unspecified: Secondary | ICD-10-CM

## 2018-04-19 DIAGNOSIS — Z6841 Body Mass Index (BMI) 40.0 and over, adult: Secondary | ICD-10-CM | POA: Diagnosis not present

## 2018-04-19 DIAGNOSIS — E559 Vitamin D deficiency, unspecified: Secondary | ICD-10-CM

## 2018-04-19 DIAGNOSIS — R42 Dizziness and giddiness: Secondary | ICD-10-CM | POA: Diagnosis not present

## 2018-04-19 NOTE — Patient Instructions (Signed)

## 2018-04-20 ENCOUNTER — Encounter: Payer: Self-pay | Admitting: Internal Medicine

## 2018-04-20 ENCOUNTER — Ambulatory Visit (INDEPENDENT_AMBULATORY_CARE_PROVIDER_SITE_OTHER): Payer: Medicare PPO | Admitting: Internal Medicine

## 2018-04-20 VITALS — BP 128/80 | HR 82 | Ht 65.35 in | Wt 245.0 lb

## 2018-04-20 DIAGNOSIS — Z794 Long term (current) use of insulin: Secondary | ICD-10-CM

## 2018-04-20 DIAGNOSIS — Z6841 Body Mass Index (BMI) 40.0 and over, adult: Secondary | ICD-10-CM

## 2018-04-20 DIAGNOSIS — E785 Hyperlipidemia, unspecified: Secondary | ICD-10-CM | POA: Diagnosis not present

## 2018-04-20 DIAGNOSIS — N183 Chronic kidney disease, stage 3 (moderate): Secondary | ICD-10-CM | POA: Diagnosis not present

## 2018-04-20 DIAGNOSIS — E1122 Type 2 diabetes mellitus with diabetic chronic kidney disease: Secondary | ICD-10-CM

## 2018-04-20 LAB — BMP8+EGFR
BUN / CREAT RATIO: 41 — AB (ref 9–23)
BUN: 60 mg/dL — ABNORMAL HIGH (ref 6–24)
CO2: 26 mmol/L (ref 20–29)
CREATININE: 1.47 mg/dL — AB (ref 0.57–1.00)
Calcium: 10.3 mg/dL — ABNORMAL HIGH (ref 8.7–10.2)
Chloride: 97 mmol/L (ref 96–106)
GFR calc Af Amer: 46 mL/min/{1.73_m2} — ABNORMAL LOW (ref >59)
GFR calc non Af Amer: 40 mL/min/{1.73_m2} — ABNORMAL LOW (ref >59)
GLUCOSE: 76 mg/dL (ref 65–99)
POTASSIUM: 3.9 mmol/L (ref 3.5–5.2)
SODIUM: 142 mmol/L (ref 134–144)

## 2018-04-20 LAB — TSH: TSH: 0.148 u[IU]/mL — AB (ref 0.450–4.500)

## 2018-04-20 LAB — HIV ANTIBODY (ROUTINE TESTING W REFLEX): HIV SCREEN 4TH GENERATION: NONREACTIVE

## 2018-04-20 LAB — T4, FREE: Free T4: 2.18 ng/dL — ABNORMAL HIGH (ref 0.82–1.77)

## 2018-04-20 LAB — HEPATITIS C ANTIBODY
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
## 2018-04-20 NOTE — Patient Instructions (Addendum)
Please continue: - Metformin 1000 mg 2x a day, with meals - Farxiga 10 mg daily in a.m. - Ozempic 0.5 mg weekly - Tresiba U200 44 units before dinner - Novolog 25 units before lunch and dinner   Please come back for a follow-up appointment in 3 months with your sugar log.

## 2018-04-22 ENCOUNTER — Other Ambulatory Visit: Payer: Self-pay | Admitting: Internal Medicine

## 2018-04-23 ENCOUNTER — Encounter: Payer: Self-pay | Admitting: Internal Medicine

## 2018-04-23 NOTE — Progress Notes (Signed)
Subjective:     Patient ID: Sarah Gallegos , female    DOB: 1960-12-03 , 58 y.o.   MRN: [id number]   Chief Complaint  Patient presents with  . Vitamin d follow-up  . Dizziness    HPI  She would like to have her vit d level checked today.   Dizziness  This is a recurrent problem. The current episode started in the past 7 days. The problem occurs daily. The problem has been unchanged. Pertinent negatives include no urinary symptoms, vertigo or visual change. The symptoms are aggravated by standing. She has tried nothing for the symptoms.     Past Medical History:  Diagnosis Date  . Diabetes mellitus without complication ([place])   . Hypercholesteremia   . Hypertension   . Hypothyroidism   . Neuropathy      Family History  Problem Relation Age of Onset  . Heart failure Mother   . Stroke Mother   . Hypertension Father   . Colon cancer Neg Hx   . Esophageal cancer Neg Hx   . Stomach cancer Neg Hx      Current Outpatient Medications:  .  aspirin 81 MG chewable tablet, Chew 81 mg by mouth daily., Disp: , Rfl:  .  carvedilol (COREG) 6.25 MG tablet, Take 6.25 mg by mouth 2 (two) times daily with a meal., Disp: , Rfl:  .  furosemide (LASIX) 40 MG tablet, Take 40 mg by mouth 2 (two) times daily., Disp: , Rfl:  .  gabapentin (NEURONTIN) 300 MG capsule, Take 600 mg by mouth 2 (two) times daily. , Disp: , Rfl:  .  hydrochlorothiazide (HYDRODIURIL) 25 MG tablet, TAKE 1 TABLET BY MOUTH DAILY, Disp: 30 tablet, Rfl: 5 .  insulin aspart (NOVOLOG FLEXPEN) 100 UNIT/ML FlexPen, Inject 15-25 Units into the skin 3 (three) times daily with meals., Disp: 15 mL, Rfl: 11 .  Insulin Degludec (TRESIBA FLEXTOUCH) 200 UNIT/ML SOPN, Inject 43 Units into the skin at bedtime., Disp: , Rfl:  .  levothyroxine (SYNTHROID) 175 MCG tablet, Take 175 mcg by mouth daily before breakfast., Disp: , Rfl:  .  lidocaine (LIDODERM) 5 %, Place 1 patch onto the skin daily. Remove & Discard patch within 12 hours or as  directed by MD, Disp: 60 patch, Rfl: 2 .  linaclotide (LINZESS) 145 MCG CAPS capsule, Take 1 capsule (145 mcg total) by mouth daily before breakfast., Disp: 30 capsule, Rfl: 3 .  MELATONIN PO, Take 1 tablet by mouth daily., Disp: , Rfl:  .  omeprazole (PRILOSEC) 40 MG capsule, Twice daily with Pylera as directed, Disp: 14 capsule, Rfl: 0 .  rosuvastatin (CRESTOR) 20 MG tablet, Take 20 mg by mouth daily., Disp: , Rfl:  .  Semaglutide (OZEMPIC) 0.25 or 0.5 MG/DOSE SOPN, Inject 0.5 mg into the skin once a week., Disp: , Rfl:  .  SYNTHROID 200 MCG tablet, TAKE 1 TABLET BY MOUTH EVERY DAY (Patient taking differently: 200 mcg. ), Disp: 90 tablet, Rfl: 1 .  telmisartan (MICARDIS) 80 MG tablet, TAKE 1 TABLET BY MOUTH DAILY, Disp: 30 tablet, Rfl: 5 .  Cholecalciferol (VITAMIN D-3) 5000 units TABS, Take 5,000 Units by mouth daily., Disp: , Rfl:  .  potassium chloride (K-DUR,KLOR-CON) 10 MEQ tablet, Take 10 mEq by mouth 2 (two) times daily., Disp: , Rfl:    Allergies  Allergen Reactions  . Lantus [Insulin Glargine] Rash and Other (See Comments)    Face breaks out really bad  . Tradjenta [Linagliptin] Rash and Other (  See Comments)    Face breaks out     Review of Systems  Constitutional: Negative.   Respiratory: Negative.   Cardiovascular: Negative.   Gastrointestinal: Negative.   Neurological: Positive for dizziness. Negative for vertigo.     Today's Vitals   04/19/18 0933  BP: 114/70  Pulse: 72  Temp: 98.1 F (36.7 C)  TempSrc: Oral  Weight: 245 lb 3.2 oz (111.2 kg)  Height: _0  (1.626 m)   Body mass index is 42.09 kg/m.   Objective:  Physical Exam Vitals signs reviewed.  Constitutional:      Appearance: Normal appearance. She is obese.  HENT:     Head: Normocephalic and atraumatic.  Cardiovascular:     Rate and Rhythm: Normal rate and regular rhythm.     Heart sounds: Normal heart sounds.  Pulmonary:     Effort: Pulmonary effort is normal.     Breath sounds: Normal breath  sounds.  Skin:    General: Skin is warm.  Neurological:     General: No focal deficit present.     Mental Status: She is alert.  Psychiatric:        Mood and Affect: Mood normal.         Assessment And Plan:     1. Vitamin D deficiency disease  She is advised to take 2000 units vit d3 capsules once daily.   2. Dizziness  Orthostatics performed. It appears she is dehydrated. She is encouraged to increase her water intake.   - Hepatitis C antibody - HIV antibody (with reflex) - BMP8+EGFR  3. Hypothyroidism, unspecified type  I will check a thyroid panel and adjust meds as needed. Importance of medication compliance was discussed with the patient.   - TSH - T4, Free  4. Class 3 severe obesity due to excess calories with serious comorbidity and body mass index (BMI) of 40.0 to 44.9 in adult [facility])  She is encouraged to strive for BMI less than 30 to decrease cardiac risk. Her initial goal is to lose ten percent of her body weight, 24 pounds. She is advised to slowly incorporate more exercise into her daily routine.   Maximino Greenland, MD

## 2018-04-24 ENCOUNTER — Telehealth: Payer: Self-pay

## 2018-04-24 NOTE — Telephone Encounter (Signed)
Left the pt a message to call back for her lab results. 

## 2018-04-24 NOTE — Telephone Encounter (Signed)
-----   Message from Glendale Chard, MD sent at 04/22/2018 12:40 PM EST ----- You are neg for hepatitis c virus. You are HIV negative. Your kidney fxn is stable, still in stage 3 range. Your calcium level is sl. Elevated. Your TSH is LOW at 0.148 - I need to adjust your thyroid meds. Kw-pls confirm how she is taking her meds. If she is taking 119mcg - needs to decrease to 150mg  m-f, 127mcg sat/sun; needs to stop farxiga aND metformin due to her kidney fxn. Needs to stop immediately.

## 2018-04-27 ENCOUNTER — Other Ambulatory Visit: Payer: Self-pay

## 2018-04-27 MED FILL — GABAPENTIN 300 MG CAPSULE: 300 | 45 days supply | Qty: 180 | Fill #2

## 2018-04-27 MED FILL — HYDROCHLOROTHIAZIDE 25 MG T: 25 | 30 days supply | Qty: 30 | Fill #3

## 2018-04-27 MED FILL — TELMISARTAN 80 MG TABS: 80 | 30 days supply | Qty: 30 | Fill #1

## 2018-04-27 MED FILL — TRESIBA FLEXTOUCH 200 UNITS: 200 | 41 days supply | Qty: 9 | Fill #3

## 2018-05-12 DIAGNOSIS — E113513 Type 2 diabetes mellitus with proliferative diabetic retinopathy with macular edema, bilateral: Secondary | ICD-10-CM | POA: Diagnosis not present

## 2018-05-12 DIAGNOSIS — E113511 Type 2 diabetes mellitus with proliferative diabetic retinopathy with macular edema, right eye: Secondary | ICD-10-CM | POA: Diagnosis not present

## 2018-05-12 DIAGNOSIS — E113512 Type 2 diabetes mellitus with proliferative diabetic retinopathy with macular edema, left eye: Secondary | ICD-10-CM | POA: Diagnosis not present

## 2018-05-19 ENCOUNTER — Other Ambulatory Visit: Payer: Self-pay | Admitting: Cardiology

## 2018-05-19 MED FILL — FUROSEMIDE 40 MG TAB: 40 | 60 days supply | Qty: 120 | Fill #0 | Status: TO

## 2018-05-22 MED FILL — OMEPRAZOLE 40 MG CPDR: 40 | 90 days supply | Qty: 90 | Fill #1

## 2018-05-26 MED FILL — TELMISARTAN 80 MG TABS: 80 | 30 days supply | Qty: 30 | Fill #2

## 2018-05-26 MED FILL — HYDROCHLOROTHIAZIDE 25 MG T: 25 | 30 days supply | Qty: 30 | Fill #4

## 2018-05-26 MED FILL — SYNTHROID 200 MCG TABLET: 200 | 90 days supply | Qty: 90 | Fill #1

## 2018-05-31 ENCOUNTER — Ambulatory Visit: Payer: Medicare PPO | Admitting: Internal Medicine

## 2018-06-02 ENCOUNTER — Other Ambulatory Visit: Payer: Self-pay | Admitting: Internal Medicine

## 2018-06-20 ENCOUNTER — Other Ambulatory Visit: Payer: Self-pay | Admitting: Internal Medicine

## 2018-06-20 MED FILL — ROSUVASTATIN CALCIUM 20 MG: 20 | 90 days supply | Qty: 90 | Fill #0

## 2018-06-21 MED FILL — POTASSIUM CHL ER M10 TABLET: 10 | 90 days supply | Qty: 90 | Fill #0

## 2018-06-28 ENCOUNTER — Other Ambulatory Visit: Payer: Self-pay

## 2018-06-28 ENCOUNTER — Ambulatory Visit (INDEPENDENT_AMBULATORY_CARE_PROVIDER_SITE_OTHER): Payer: Medicare PPO | Admitting: Internal Medicine

## 2018-06-28 ENCOUNTER — Encounter: Payer: Self-pay | Admitting: Internal Medicine

## 2018-06-28 VITALS — Temp 98.4°F | Ht 64.6 in | Wt 250.4 lb

## 2018-06-28 DIAGNOSIS — N183 Chronic kidney disease, stage 3 unspecified: Secondary | ICD-10-CM

## 2018-06-28 DIAGNOSIS — I129 Hypertensive chronic kidney disease with stage 1 through stage 4 chronic kidney disease, or unspecified chronic kidney disease: Secondary | ICD-10-CM | POA: Diagnosis not present

## 2018-06-28 DIAGNOSIS — Z1231 Encounter for screening mammogram for malignant neoplasm of breast: Secondary | ICD-10-CM | POA: Diagnosis not present

## 2018-06-28 DIAGNOSIS — N6320 Unspecified lump in the left breast, unspecified quadrant: Secondary | ICD-10-CM | POA: Diagnosis not present

## 2018-06-28 MED ORDER — TELMISARTAN 80 MG PO TABS: 80.0000 mg | ORAL_TABLET | Freq: Every day | ORAL | 2 refills | Status: AC

## 2018-06-28 MED ORDER — CEPHALEXIN 500 MG PO CAPS: 500.0000 mg | ORAL_CAPSULE | Freq: Three times a day (TID) | ORAL | 0 refills | Status: AC

## 2018-06-28 MED ORDER — HYDROCHLOROTHIAZIDE 25 MG PO TABS: 25.0000 mg | ORAL_TABLET | Freq: Every day | ORAL | 2 refills | Status: AC

## 2018-06-28 MED FILL — TELMISARTAN 80 MG TABS: 80 | 90 days supply | Qty: 90 | Fill #0

## 2018-06-28 MED FILL — HYDROCHLOROTHIAZIDE 25 MG T: 25 | 90 days supply | Qty: 90 | Fill #0

## 2018-06-28 MED FILL — CEPHALEXIN 500 MG CAPSULE: 500 | 13 days supply | Qty: 40 | Fill #0

## 2018-06-28 NOTE — Patient Instructions (Signed)

## 2018-06-30 DIAGNOSIS — N183 Chronic kidney disease, stage 3 (moderate): Secondary | ICD-10-CM | POA: Diagnosis not present

## 2018-06-30 DIAGNOSIS — E785 Hyperlipidemia, unspecified: Secondary | ICD-10-CM | POA: Diagnosis not present

## 2018-06-30 DIAGNOSIS — E1122 Type 2 diabetes mellitus with diabetic chronic kidney disease: Secondary | ICD-10-CM | POA: Diagnosis not present

## 2018-06-30 DIAGNOSIS — I129 Hypertensive chronic kidney disease with stage 1 through stage 4 chronic kidney disease, or unspecified chronic kidney disease: Secondary | ICD-10-CM | POA: Diagnosis not present

## 2018-07-02 NOTE — Progress Notes (Signed)
Subjective:     Patient ID: Sarah Gallegos , female    DOB: [birth year] , 58 y.o.   MRN: [id number]   Chief Complaint  Patient presents with  . Hypertension  . Breast Mass    left/sensitive to touch/    HPI  Hypertension  This is a chronic problem. The current episode started more than 1 year ago. The problem has been gradually improving since onset. The problem is controlled. Pertinent negatives include no blurred vision, chest pain, palpitations or shortness of breath. Risk factors for coronary artery disease include diabetes mellitus, dyslipidemia, obesity, post-menopausal state and sedentary lifestyle. Hypertensive end-organ damage includes kidney disease.    Breast Mass  She complains of left breast mass.  She first noticed about two weeks ago. She reports she has had boils under her breast in the past. She denies h/o breast cancer. Denies h/o abnormal mammogram. Denies nipple discharge.   Past Medical History:  Diagnosis Date  . Diabetes mellitus without complication ([place])   . Hypercholesteremia   . Hypertension   . Hypothyroidism   . Neuropathy      Family History  Problem Relation Age of Onset  . Heart failure Mother   . Stroke Mother   . Hypertension Father   . Colon cancer Neg Hx   . Esophageal cancer Neg Hx   . Stomach cancer Neg Hx      Current Outpatient Medications:  .  aspirin 81 MG chewable tablet, Chew 81 mg by mouth daily., Disp: , Rfl:  .  carvedilol (COREG) 6.25 MG tablet, Take 6.25 mg by mouth 2 (two) times daily with a meal., Disp: , Rfl:  .  furosemide (LASIX) 40 MG tablet, TAKE 1 TABLET BY MOUTH TWICE DAILY, Disp: 120 tablet, Rfl: 1 .  gabapentin (NEURONTIN) 300 MG capsule, Take 600 mg by mouth 2 (two) times daily. , Disp: , Rfl:  .  hydrochlorothiazide (HYDRODIURIL) 25 MG tablet, Take 1 tablet (25 mg total) by mouth daily., Disp: 90 tablet, Rfl: 2 .  insulin aspart (NOVOLOG FLEXPEN) 100 UNIT/ML FlexPen, Inject 15-25 Units into the skin 3  (three) times daily with meals., Disp: 15 mL, Rfl: 11 .  Insulin Degludec (TRESIBA FLEXTOUCH) 200 UNIT/ML SOPN, Inject 43 Units into the skin at bedtime., Disp: , Rfl:  .  levothyroxine (SYNTHROID) 175 MCG tablet, Take 175 mcg by mouth daily before breakfast., Disp: , Rfl:  .  lidocaine (LIDODERM) 5 %, Place 1 patch onto the skin daily. Remove & Discard patch within 12 hours or as directed by MD, Disp: 60 patch, Rfl: 2 .  linaclotide (LINZESS) 145 MCG CAPS capsule, Take 1 capsule (145 mcg total) by mouth daily before breakfast., Disp: 30 capsule, Rfl: 3 .  MELATONIN PO, Take 1 tablet by mouth daily., Disp: , Rfl:  .  omeprazole (PRILOSEC) 40 MG capsule, Twice daily with Pylera as directed, Disp: 14 capsule, Rfl: 0 .  potassium chloride (K-DUR,KLOR-CON) 10 MEQ tablet, TAKE 1 TABLET BY MOUTH EVERY DAY WITH FOOD, Disp: 90 tablet, Rfl: 1 .  rosuvastatin (CRESTOR) 20 MG tablet, Take 20 mg by mouth daily., Disp: , Rfl:  .  Semaglutide (OZEMPIC) 0.25 or 0.5 MG/DOSE SOPN, Inject 0.5 mg into the skin once a week., Disp: , Rfl:  .  telmisartan (MICARDIS) 80 MG tablet, Take 1 tablet (80 mg total) by mouth daily., Disp: 90 tablet, Rfl: 2 .  cephALEXin (KEFLEX) 500 MG capsule, Take 1 capsule (500 mg total) by mouth 3 (three) times  daily for 10 days., Disp: 40 capsule, Rfl: 0 .  Cholecalciferol (VITAMIN D-3) 5000 units TABS, Take 5,000 Units by mouth daily., Disp: , Rfl:    Allergies  Allergen Reactions  . Lantus [Insulin Glargine] Rash and Other (See Comments)    Face breaks out really bad  . Tradjenta [Linagliptin] Rash and Other (See Comments)    Face breaks out     Review of Systems  Constitutional: Negative.   Eyes: Negative for blurred vision.  Respiratory: Negative.  Negative for shortness of breath.   Cardiovascular: Negative.  Negative for chest pain and palpitations.  Gastrointestinal: Negative.   Neurological: Negative.   Psychiatric/Behavioral: Negative.      Today's Vitals   06/28/18  1142  Temp: 98.4 F (36.9 C)  TempSrc: Oral  SpO2: 98%  Weight: 250 lb 6.4 oz (113.6 kg)  Height: 5' 4.6" (1.641 m)   Body mass index is 42.19 kg/m.   Objective:  Physical Exam Vitals signs and nursing note reviewed.  Constitutional:      Appearance: Normal appearance.  HENT:     Head: Normocephalic and atraumatic.  Cardiovascular:     Rate and Rhythm: Normal rate and regular rhythm.     Heart sounds: Normal heart sounds.  Pulmonary:     Effort: Pulmonary effort is normal.     Breath sounds: Normal breath sounds.  Chest:     Breasts:        Right: Normal. No swelling, bleeding, inverted nipple, mass or nipple discharge.        Left: Mass present. No swelling, bleeding or nipple discharge.       Comments: Lesion appears superficial. Sl. Warmth. No erythema.  Skin:    General: Skin is warm.  Neurological:     General: No focal deficit present.     Mental Status: She is alert.  Psychiatric:        Mood and Affect: Mood normal.        Behavior: Behavior normal.         Assessment And Plan:     1. Hypertensive nephropathy  Fair control. She will continue with current meds. She is encouraged to avoid adding salt to her foods. Importance of regular exercise was also discussed with the patient.   2. Chronic renal disease, stage III (HCC)  Chronic. She is encouraged to stay well hydrated.  Importance of optimal bp/bs control was discussed with the patient.   3. Left breast mass  - MM DIAG BREAST TOMO BILATERAL; Future - US BREAST LTD UNI LEFT INC AXILLA; Future  4. Breast cancer screening by mammogram  - MM Digital Screening; Future        Maximino Greenland, MD    THE PATIENT IS ENCOURAGED TO PRACTICE SOCIAL DISTANCING DUE TO THE COVID-19 PANDEMIC.

## 2018-07-13 ENCOUNTER — Ambulatory Visit
Admission: RE | Admit: 2018-07-13 | Discharge: 2018-07-13 | Disposition: A | Discharge status: Home / Self Care | Payer: Medicare PPO | Source: Ambulatory Visit | Attending: Internal Medicine | Admitting: Internal Medicine

## 2018-07-13 ENCOUNTER — Other Ambulatory Visit: Payer: Self-pay

## 2018-07-13 DIAGNOSIS — N6320 Unspecified lump in the left breast, unspecified quadrant: Secondary | ICD-10-CM | POA: Diagnosis not present

## 2018-07-13 DIAGNOSIS — N6081 Other benign mammary dysplasias of right breast: Secondary | ICD-10-CM | POA: Diagnosis not present

## 2018-07-18 ENCOUNTER — Other Ambulatory Visit: Payer: Self-pay | Admitting: Internal Medicine

## 2018-07-18 MED FILL — TRESIBA FLEXTOUCH 200 UNITS: 200 | 41 days supply | Qty: 9 | Fill #0

## 2018-07-18 MED FILL — FUROSEMIDE 40 MG TAB: 40 | 60 days supply | Qty: 120 | Fill #0

## 2018-07-18 MED FILL — CARVEDILOL 6.25 MG TABLET: 6.25 | 90 days supply | Qty: 180 | Fill #0

## 2018-07-19 MED FILL — GABAPENTIN 300 MG CAPSULE: 300 | 45 days supply | Qty: 180 | Fill #0

## 2018-07-19 NOTE — Telephone Encounter (Signed)
Gabapentin refill

## 2018-07-20 ENCOUNTER — Encounter: Payer: Self-pay | Admitting: Internal Medicine

## 2018-07-20 ENCOUNTER — Ambulatory Visit (INDEPENDENT_AMBULATORY_CARE_PROVIDER_SITE_OTHER): Payer: Medicare PPO | Admitting: Internal Medicine

## 2018-07-20 DIAGNOSIS — Z794 Long term (current) use of insulin: Secondary | ICD-10-CM

## 2018-07-20 DIAGNOSIS — E1122 Type 2 diabetes mellitus with diabetic chronic kidney disease: Secondary | ICD-10-CM

## 2018-07-20 DIAGNOSIS — E785 Hyperlipidemia, unspecified: Secondary | ICD-10-CM | POA: Diagnosis not present

## 2018-07-20 DIAGNOSIS — N183 Chronic kidney disease, stage 3 unspecified: Secondary | ICD-10-CM

## 2018-07-20 DIAGNOSIS — Z6841 Body Mass Index (BMI) 40.0 and over, adult: Secondary | ICD-10-CM | POA: Diagnosis not present

## 2018-07-20 NOTE — Progress Notes (Signed)
Patient ID: Sarah Gallegos, female   DOB: June 01, 1960, 58 y.o.   MRN: [id number]   Patient location: Home My location: Office  Referring Provider: Glendale Chard, MD  I connected with the patient on 07/20/18 at  9:10 AM EDT by a video enabled telemedicine application and verified that I am speaking with the correct person.   I discussed the limitations of evaluation and management by telemedicine and the availability of in person appointments. The patient expressed understanding and agreed to proceed.   Details of the encounter are shown below.  HPI: LYNANNE DELGRECO is a 58 y.o.-year-old female, presenting for follow-up for DM2, dx in 2000, insulin-dependent since 2015-2016, uncontrolled, with complications (CKD, PN).  Last visit 5 months ago.  Last hemoglobin A1c was: Lab Results  Component Value Date   HGBA1C 10.5 (H) 02/16/2018   HGBA1C 13.0 (A) 12/08/2017   HGBA1C >15.5 (H) 10/10/2015  09/22/2017: HbA1c >15.5% 06/02/2017: HbA1c 12.9%  Pt is on a regimen of: - Metformin 1000 mg 2x a day, with meals - Farxiga 10 mg daily in a.m. >> ran out for 1 week - Ozempic 0.5 mg weekly - Tresiba U200 44 units around dinnertime >> off for 1 week (waiting for the mail order to arrive) - Novolog 25 units before lunch and dinner  Pt checks sugars once a day: - am: 57, 100-140, 200 >> 76-117, 227 (off insulin) - 2h after b'fast: n/c - before lunch: n/c - 2h after lunch: n/c - before dinner: n/c - 2h after dinner: 130-140, 196 >> 130-140 - bedtime: n/c - nighttime: n/c Lowest sugar was 50s >> 57 x1 (took Novolog and did not eat) >> 76; she has hypoglycemia awareness in the 70s. Highest sugar was 500 >> 200 >> 227.  Glucometer: Accuchek  Pt's meals are: - Breakfast:  Skips b/c nausea - Lunch: skips most of the time - Dinner: meat + veggies + starch - Snacks: none She decreased juice over the last months, but not stopped completely.  She was going to Silver sneakers before, but  stopped before last visit due to her divorce process.  -+ CKD (sees nephrology: Dr Johnney Ou), last BUN/creatinine:  Lab Results  Component Value Date   BUN 60 (H) 04/19/2018   BUN 49 (H) 02/16/2018   CREATININE 1.47 (H) 04/19/2018   CREATININE 1.76 (H) 02/16/2018  On telmisartan 80.  -+ HL; last set of lipids: Lab Results  Component Value Date   CHOL 109 09/22/2017   HDL 35 09/22/2017   LDLCALC 44 09/22/2017   TRIG 149 09/22/2017   On Crestor 20.  - last eye exam was in 03/2018: + DR reportedly. + cataract Sx B. She had Laser Sx for retinal hemorrhage.  -She does have numbness and tingling in her feet.  On Neurontin.  Pt has FH of DM in M, sister.  She also has a history of post ablative hypothyroidism after treatment of Graves' disease.  She is on Synthroid 200 >> decreased to 175 mcg 5/7, 200 mcg 2/7 in  03/2018.  This is managed by PCP.  Latest TSH was slightly low: Lab Results  Component Value Date   TSH 0.148 (L) 04/19/2018  10/27/2017:  TSH 1.51 06/02/2017: TSH 1.9  She also has a history of anemia, back pain, status post surgery of her back in 2003.  ROS: Constitutional: + weight gain/no weight loss, no fatigue, no subjective hyperthermia, no subjective hypothermia Eyes: no blurry vision, no xerophthalmia ENT: no sore throat, no nodules  palpated in neck, no dysphagia, no odynophagia, no hoarseness Cardiovascular: no CP/no SOB/no palpitations/no leg swelling Respiratory: no cough/no SOB/no wheezing Gastrointestinal: no N/no V/no D/+ C/no acid reflux Musculoskeletal: no muscle aches/no joint aches Skin: no rashes, no hair loss Neurological: no tremors/+ numbness/+ tingling/no dizziness  I reviewed pt's medications, allergies, PMH, social hx, family hx, and changes were documented in the history of present illness. Otherwise, unchanged from my initial visit note.  Past Medical History:  Diagnosis Date  . Diabetes mellitus without complication ([place])   .  Hypercholesteremia   . Hypertension   . Hypothyroidism   . Neuropathy    Past Surgical History:  Procedure Laterality Date  . ABDOMINAL HYSTERECTOMY     2008  . BACK SURGERY     2003, [facility], [place], [place]  . BREAST BIOPSY     2003, [facility], [place], [place] SURGERY     2010,  for graves disease. [place], [place]     2003, [facility], [place], [place]   Social History   Socioeconomic History  . Marital status: Married    Spouse name: Not on file  . Number of children: 3  . Years of education: Not on file  . Highest education level: Not on file  Occupational History  . Occupation: unemployed, on disability since 2002  Tobacco Use  . Smoking status: Former Smoker    Packs/day: 1.00    Years: 7.00    Pack years: 7.00    Types: Cigarettes  . Smokeless tobacco: Never Used  . Tobacco comment: quit in 1987  Substance and Sexual Activity  . Alcohol use: No    Alcohol/week: 0.0 standard drinks  . Drug use: No   Current Outpatient Medications on File Prior to Visit  Medication Sig Dispense Refill  . aspirin 81 MG chewable tablet Chew 81 mg by mouth daily.    . carvedilol (COREG) 6.25 MG tablet Take 6.25 mg by mouth 2 (two) times daily with a meal.    . Cholecalciferol (VITAMIN D-3) 5000 units TABS Take 5,000 Units by mouth daily.    . furosemide (LASIX) 40 MG tablet TAKE 1 TABLET BY MOUTH TWICE DAILY 120 tablet 1  . gabapentin (NEURONTIN) 300 MG capsule TAKE 2 CAPSULE BY MOUTH TWO TIMES DAILY 180 capsule 2  . hydrochlorothiazide (HYDRODIURIL) 25 MG tablet Take 1 tablet (25 mg total) by mouth daily. 90 tablet 2  . insulin aspart (NOVOLOG FLEXPEN) 100 UNIT/ML FlexPen Inject 15-25 Units into the skin 3 (three) times daily with meals. 15 mL 11  . Insulin Degludec (TRESIBA FLEXTOUCH) 200 UNIT/ML SOPN Inject 43 Units into the skin at bedtime.    Marland Kitchen levothyroxine (SYNTHROID) 175 MCG tablet Take 175 mcg by mouth daily before breakfast.     . lidocaine (LIDODERM) 5 % Place 1 patch onto the skin daily. Remove & Discard patch within 12 hours or as directed by MD 60 patch 2  . linaclotide (LINZESS) 145 MCG CAPS capsule Take 1 capsule (145 mcg total) by mouth daily before breakfast. 30 capsule 3  . MELATONIN PO Take 1 tablet by mouth daily.    Marland Kitchen omeprazole (PRILOSEC) 40 MG capsule Twice daily with Pylera as directed 14 capsule 0  . potassium chloride (K-DUR,KLOR-CON) 10 MEQ tablet TAKE 1 TABLET BY MOUTH EVERY DAY WITH FOOD 90 tablet 1  . rosuvastatin (CRESTOR) 20 MG tablet Take 20 mg by mouth daily.    . Semaglutide (Kiron)  0.25 or 0.5 MG/DOSE SOPN Inject 0.5 mg into the skin once a week.    . telmisartan (MICARDIS) 80 MG tablet Take 1 tablet (80 mg total) by mouth daily. 90 tablet 2   No current facility-administered medications on file prior to visit.    Allergies  Allergen Reactions  . Lantus [Insulin Glargine] Rash and Other (See Comments)    Face breaks out really bad  . Tradjenta [Linagliptin] Rash and Other (See Comments)    Face breaks out   Family History  Problem Relation Age of Onset  . Heart failure Mother   . Stroke Mother   . Hypertension Father   . Colon cancer Neg Hx   . Esophageal cancer Neg Hx   . Stomach cancer Neg Hx    Also, history of HTN in mother, HL in father.  PE: There were no vitals taken for this visit. Wt Readings from Last 3 Encounters:  06/28/18 250 lb 6.4 oz (113.6 kg)  04/20/18 245 lb (111.1 kg)  04/19/18 245 lb 3.2 oz (111.2 kg)   Constitutional:  in NAD  The physical exam was not performed (virtual visit).  ASSESSMENT: 1. DM2, insulin-dependent, uncontrolled, with long-term complications - CKD - PN  2. HL  3.  Obesity class III  PLAN:  1. Patient with longstanding, uncontrolled, type 2 diabetes, on oral antidiabetic regimen, weekly GLP-1 receptor agonist and basal-bolus insulin regimen.  Her HbA1c decreased gradually from undetectably high 13% to 10.5% before last  visit. -Before last visit, her diet was very poor and we discussed about improving this.  She was telling me that she cannot really eat in the morning because of nausea.  She was sipping juice throughout the day.  I strongly advised her to stop juice. At last visit, sugars were much better per her report, but she could not remember the values well and she had no meter or log with her.  Therefore, we continued the same regimen and I strongly advised her to bring the meter or a log when she came back -At this visit, she tells me that she ran out of insulin due to a delay in delivery from her mail order pharmacy.  In the last week, sugar started to increase, but before this, they were at goal.  She thinks that her insulin is coming today and she will restart it immediately.  I do not feel that we need to change the dose for now.  She also ran out of her Farxiga samples and she does not feel that she can refill it for now due to price.  I advised her to let me know when I can call this in for her.  We may not absolutely need this if her sugars remain controlled, but I would like to use it due to its cardiovascular and renal benefits. - I suggested to:  Patient Instructions   Patient Instructions  Please continue: - Metformin 1000 mg 2x a day, with meals - Ozempic 0.5 mg weekly - Novolog 25 units before lunch and dinner  Restart: - Tresiba U200 44 units around dinnertime  Let me know when we can refill [place].  Please come back for a follow-up appointment in 3 months with your sugar log.  -We will recheck her HbA1c when she returns to the clinic - continue checking sugars at different times of the day - check 2-3 x a day, rotating checks - advised for yearly eye exams >> she is UTD - Return to clinic  in 3 mo with sugar log    2. HL - Reviewed latest lipid panel from 08/2017: Excellent Lab Results  Component Value Date   CHOL 109 09/22/2017   HDL 35 09/22/2017   LDLCALC 44 09/22/2017   TRIG  149 09/22/2017  - Continues Crestor without side effects.  3.  Obesity class III -Continue GLP-1 receptor agonist, which should also help with weight loss.  She is unfortunately off the SGLT 2 inhibitor for now due to price.  I am hoping we can restart this soon. -Gained 5 pounds since last visit  Philemon Kingdom, MD PhD [facility]

## 2018-07-20 NOTE — Patient Instructions (Addendum)
Patient Instructions  Please continue: - Metformin 1000 mg 2x a day, with meals - Ozempic 0.5 mg weekly - Novolog 25 units before lunch and dinner  Restart: - Tresiba U200 44 units around dinnertime  Let me know when we can refill [place].  Please come back for a follow-up appointment in 3 months with your sugar log.

## 2018-07-21 ENCOUNTER — Encounter: Payer: Self-pay | Admitting: Cardiology

## 2018-07-21 ENCOUNTER — Ambulatory Visit (INDEPENDENT_AMBULATORY_CARE_PROVIDER_SITE_OTHER): Payer: Medicare PPO | Admitting: Cardiology

## 2018-07-21 ENCOUNTER — Other Ambulatory Visit: Payer: Self-pay

## 2018-07-21 VITALS — BP 138/80 | Ht 68.0 in | Wt 250.0 lb

## 2018-07-21 DIAGNOSIS — I5032 Chronic diastolic (congestive) heart failure: Secondary | ICD-10-CM | POA: Diagnosis not present

## 2018-07-21 DIAGNOSIS — I10 Essential (primary) hypertension: Secondary | ICD-10-CM | POA: Diagnosis not present
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
## 2018-08-22 ENCOUNTER — Other Ambulatory Visit: Payer: Self-pay | Admitting: Internal Medicine

## 2018-08-22 MED FILL — OMEPRAZOLE 40 MG CPDR: 40 | 30 days supply | Qty: 30 | Fill #0

## 2018-08-24 ENCOUNTER — Other Ambulatory Visit: Payer: Self-pay | Admitting: Internal Medicine

## 2018-08-25 MED FILL — SYNTHROID 200 MCG TABLET: 200 | 90 days supply | Qty: 90 | Fill #0

## 2018-08-31 MED FILL — PENICILLIN VK 500 MG TABLET: 500 | 8 days supply | Qty: 29 | Fill #0

## 2018-09-21 MED FILL — ROSUVASTATIN CALCIUM 20 MG: 20 | 90 days supply | Qty: 90 | Fill #0

## 2018-09-21 MED FILL — TRESIBA FLEXTOUCH 200 UNITS: 200 | 41 days supply | Qty: 9 | Fill #0

## 2018-09-21 MED FILL — POTASSIUM CHLORIDE CRYS ER: 10 | 90 days supply | Qty: 90 | Fill #0

## 2018-09-27 ENCOUNTER — Other Ambulatory Visit: Payer: Self-pay | Admitting: Cardiology

## 2018-09-27 MED FILL — OMEPRAZOLE DR 40 MG CAPSULE: 40 | 30 days supply | Qty: 30 | Fill #1

## 2018-09-27 MED FILL — HYDROCHLOROTHIAZIDE 25 MG T: 25 | 90 days supply | Qty: 90 | Fill #0

## 2018-09-27 MED FILL — FUROSEMIDE 40 MG TAB: 40 | 60 days supply | Qty: 120 | Fill #0

## 2018-09-28 MED FILL — PENICILLIN VK 500 MG TABLET: 500 | 7 days supply | Qty: 29 | Fill #0

## 2018-09-28 MED FILL — IBUPROFEN 800 MG TABS: 800 | 4 days supply | Qty: 16 | Fill #0

## 2018-10-09 MED FILL — TELMISARTAN 80 MG TABS: 80 | 90 days supply | Qty: 90 | Fill #0

## 2018-10-09 MED FILL — LIDOCAINE PATCH 5%: 5 | 60 days supply | Qty: 60 | Fill #1

## 2018-10-13 ENCOUNTER — Other Ambulatory Visit: Payer: Self-pay | Admitting: Internal Medicine

## 2018-10-13 MED FILL — GABAPENTIN 300 MG CAPSULE: 300 | 45 days supply | Qty: 180 | Fill #0

## 2018-10-17 MED FILL — metFORMIN HCL 1000 MG TABS: 1000 | 90 days supply | Qty: 180 | Fill #0

## 2018-10-30 MED FILL — OMEPRAZOLE DR 40 MG CAPSULE: 40 | 30 days supply | Qty: 30 | Fill #2

## 2018-11-01 ENCOUNTER — Other Ambulatory Visit: Payer: Self-pay

## 2018-11-01 ENCOUNTER — Ambulatory Visit (INDEPENDENT_AMBULATORY_CARE_PROVIDER_SITE_OTHER): Payer: Medicare PPO | Admitting: Internal Medicine

## 2018-11-01 ENCOUNTER — Encounter: Payer: Self-pay | Admitting: Internal Medicine

## 2018-11-01 VITALS — BP 122/64 | HR 80 | Temp 98.8°F | Ht 64.6 in | Wt 253.6 lb

## 2018-11-01 DIAGNOSIS — E1122 Type 2 diabetes mellitus with diabetic chronic kidney disease: Secondary | ICD-10-CM

## 2018-11-01 DIAGNOSIS — N183 Chronic kidney disease, stage 3 unspecified: Secondary | ICD-10-CM

## 2018-11-01 DIAGNOSIS — R350 Frequency of micturition: Secondary | ICD-10-CM | POA: Diagnosis not present

## 2018-11-01 DIAGNOSIS — N898 Other specified noninflammatory disorders of vagina: Secondary | ICD-10-CM | POA: Diagnosis not present

## 2018-11-01 DIAGNOSIS — E039 Hypothyroidism, unspecified: Secondary | ICD-10-CM

## 2018-11-01 DIAGNOSIS — I129 Hypertensive chronic kidney disease with stage 1 through stage 4 chronic kidney disease, or unspecified chronic kidney disease: Secondary | ICD-10-CM | POA: Diagnosis not present

## 2018-11-01 DIAGNOSIS — Z6841 Body Mass Index (BMI) 40.0 and over, adult: Secondary | ICD-10-CM | POA: Diagnosis not present

## 2018-11-01 DIAGNOSIS — Z794 Long term (current) use of insulin: Secondary | ICD-10-CM

## 2018-11-01 LAB — POCT URINALYSIS DIPSTICK
Bilirubin, UA: NEGATIVE
Glucose, UA: NEGATIVE
Ketones, UA: NEGATIVE
Leukocytes, UA: NEGATIVE
Nitrite, UA: NEGATIVE
Protein, UA: POSITIVE — AB
Spec Grav, UA: 1.015 (ref 1.010–1.025)
Urobilinogen, UA: 0.2 E.U./dL
pH, UA: 5.5 (ref 5.0–8.0)

## 2018-11-01 LAB — POCT UA - MICROALBUMIN
Albumin/Creatinine Ratio, Urine, POC: >300
Creatinine, POC: 50 mg/dL
Microalbumin Ur, POC: 150 mg/L

## 2018-11-01 MED ORDER — FLUCONAZOLE 150 MG PO TABS: 150.0000 mg | ORAL_TABLET | Freq: Every day | ORAL | 0 refills | Status: AC

## 2018-11-01 NOTE — Progress Notes (Signed)
Subjective:     Patient ID: Sarah Gallegos , female    DOB: [birth year] , 58 y.o.   MRN: [id number]   Chief Complaint  Patient presents with  . Hypertension  . Diabetes    HPI  She is here today for bp/dm check. She is also followed by Endocrinology; however, she has not had any recent bloodwork. Her last visit was virtual in April 2020.   Hypertension This is a chronic problem. The current episode started more than 1 year ago. The problem has been gradually improving since onset. The problem is controlled. Pertinent negatives include no blurred vision, chest pain, palpitations or shortness of breath. Risk factors for coronary artery disease include diabetes mellitus, dyslipidemia, obesity, post-menopausal state and sedentary lifestyle. Hypertensive end-organ damage includes kidney disease.  Diabetes She presents for her follow-up diabetic visit. She has type 2 diabetes mellitus. There are no hypoglycemic associated symptoms. Pertinent negatives for diabetes include no blurred vision and no chest pain. There are no hypoglycemic complications. Risk factors for coronary artery disease include diabetes mellitus, dyslipidemia, hypertension, sedentary lifestyle and post-menopausal.     Past Medical History:  Diagnosis Date  . Diabetes mellitus without complication ([place])   . Hypercholesteremia   . Hypertension   . Hypothyroidism   . Neuropathy      Family History  Problem Relation Age of Onset  . Heart failure Mother   . Stroke Mother   . Hypertension Father   . Colon cancer Neg Hx   . Esophageal cancer Neg Hx   . Stomach cancer Neg Hx      Current Outpatient Medications:  .  aspirin 81 MG chewable tablet, Chew 81 mg by mouth daily., Disp: , Rfl:  .  carvedilol (COREG) 6.25 MG tablet, Take 6.25 mg by mouth 2 (two) times daily with a meal., Disp: , Rfl:  .  Cholecalciferol (VITAMIN D-3) 5000 units TABS, Take 5,000 Units by mouth daily., Disp: , Rfl:  .  dapagliflozin  propanediol (FARXIGA) 10 MG TABS tablet, Take 10 mg by mouth daily., Disp: , Rfl:  .  furosemide (LASIX) 40 MG tablet, TAKE 1 TABLET BY MOUTH TWICE DAILY, Disp: 120 tablet, Rfl: 0 .  gabapentin (NEURONTIN) 300 MG capsule, TAKE 2 CAPSULE BY MOUTH TWO TIMES DAILY, Disp: 180 capsule, Rfl: 2 .  hydrochlorothiazide (HYDRODIURIL) 25 MG tablet, Take 1 tablet (25 mg total) by mouth daily., Disp: 90 tablet, Rfl: 2 .  insulin aspart (NOVOLOG FLEXPEN) 100 UNIT/ML FlexPen, Inject 15-25 Units into the skin 3 (three) times daily with meals., Disp: 15 mL, Rfl: 11 .  Insulin Degludec (TRESIBA FLEXTOUCH) 200 UNIT/ML SOPN, Inject 43 Units into the skin at bedtime., Disp: , Rfl:  .  lidocaine (LIDODERM) 5 %, Place 1 patch onto the skin daily. Remove & Discard patch within 12 hours or as directed by MD, Disp: 60 patch, Rfl: 2 .  linaclotide (LINZESS) 145 MCG CAPS capsule, Take 1 capsule (145 mcg total) by mouth daily before breakfast., Disp: 30 capsule, Rfl: 3 .  MELATONIN PO, Take 1 tablet by mouth as needed. , Disp: , Rfl:  .  metFORMIN (GLUCOPHAGE) 1000 MG tablet, TAKE 1 TABLET BY MOUTH TWO TIMES DAILY WITH MORNING AND EVENING MEALS, Disp: 180 tablet, Rfl: 0 .  omeprazole (PRILOSEC) 40 MG capsule, TAKE 1 CAPSULE BY MOUTH EVERY DAY BEFORE A MEAL, Disp: 30 capsule, Rfl: 5 .  potassium chloride (K-DUR,KLOR-CON) 10 MEQ tablet, TAKE 1 TABLET BY MOUTH EVERY DAY WITH FOOD, Disp:  90 tablet, Rfl: 1 .  rosuvastatin (CRESTOR) 20 MG tablet, Take 20 mg by mouth daily., Disp: , Rfl:  .  Semaglutide (OZEMPIC) 0.25 or 0.5 MG/DOSE SOPN, Inject 0.5 mg into the skin once a week., Disp: , Rfl:  .  telmisartan (MICARDIS) 80 MG tablet, Take 1 tablet (80 mg total) by mouth daily., Disp: 90 tablet, Rfl: 2 .  SYNTHROID 200 MCG tablet, TAKE 1 TABLET BY MOUTH EVERY DAY, Disp: 90 tablet, Rfl: 1   Allergies  Allergen Reactions  . Lantus [Insulin Glargine] Rash and Other (See Comments)    Face breaks out really bad  . Tradjenta [Linagliptin]  Rash and Other (See Comments)    Face breaks out     Review of Systems  Constitutional: Negative.   Eyes: Negative for blurred vision.  Respiratory: Negative.  Negative for shortness of breath.   Cardiovascular: Negative.  Negative for chest pain and palpitations.  Gastrointestinal: Negative.   Genitourinary:       She c/o "feminine itch". She denies vaginal discharge. Attributes sx to elevated BS. She is not currently sexually active.   Neurological: Negative.   Psychiatric/Behavioral: Negative.      Today's Vitals   11/01/18 0936  BP: 122/64  Pulse: 80  Temp: 98.8 F (37.1 C)  TempSrc: Oral  Weight: 253 lb 9.6 oz (115 kg)  Height: 5' 4.6" (1.641 m)  PainSc: 7   PainLoc: Back   Body mass index is 42.73 kg/m.   Objective:  Physical Exam Vitals signs and nursing note reviewed.  Constitutional:      Appearance: Normal appearance.  HENT:     Head: Normocephalic and atraumatic.  Cardiovascular:     Rate and Rhythm: Normal rate and regular rhythm.     Heart sounds: Normal heart sounds.  Pulmonary:     Effort: Pulmonary effort is normal.     Breath sounds: Normal breath sounds.  Skin:    General: Skin is warm.  Neurological:     General: No focal deficit present.     Mental Status: She is alert.  Psychiatric:        Mood and Affect: Mood normal.        Behavior: Behavior normal.         Assessment And Plan:     1. Hypertensive nephropathy  Chronic, well controlled. She will continue with current meds. She is encouraged to avoid adding salt to her foods.   2. Type 2 diabetes mellitus with stage 3 chronic kidney disease, with long-term current use of insulin ([place])  Diabetic foot exam was performed. I DISCUSSED WITH THE PATIENT AT LENGTH REGARDING THE GOALS OF GLYCEMIC CONTROL AND POSSIBLE LONG-TERM COMPLICATIONS.  I  ALSO STRESSED THE IMPORTANCE OF COMPLIANCE WITH HOME GLUCOSE MONITORING, DIETARY RESTRICTIONS INCLUDING AVOIDANCE OF SUGARY DRINKS/PROCESSED  FOODS,  ALONG WITH REGULAR EXERCISE.  I  ALSO STRESSED THE IMPORTANCE OF ANNUAL EYE EXAMS, SELF FOOT CARE AND COMPLIANCE WITH OFFICE VISITS.  - Lipid panel - CMP14+EGFR - Hemoglobin A1c - CBC no Diff  3. Hypothyroidism, unspecified type  I will check thyroid panel and adjust meds as needed.  - TSH - T4, Free  4. Class 3 severe obesity due to excess calories with serious comorbidity and body mass index (BMI) of 40.0 to 44.9 in adult [facility])  Importance of achieving optimal weight to decrease risk of cardiovascular disease and cancers was discussed with the patient in full detail. She is encouraged to start slowly - start with 10  minutes twice daily at least three to four days per week and to gradually build to 30 minutes five days weekly. She was given tips to incorporate more activity into her daily routine - take stairs when possible, park farther away from grocery stores, etc.    Maximino Greenland, MD    THE PATIENT IS ENCOURAGED TO PRACTICE SOCIAL DISTANCING DUE TO THE COVID-19 PANDEMIC.

## 2018-11-01 NOTE — Patient Instructions (Signed)
Diabetes Mellitus and Foot Care Foot care is an important part of your health, especially when you have diabetes. Diabetes may cause you to have problems because of poor blood flow (circulation) to your feet and legs, which can cause your skin to:  Become thinner and drier.  Break more easily.  Heal more slowly.  Peel and crack. You may also have nerve damage (neuropathy) in your legs and feet, causing decreased feeling in them. This means that you may not notice minor injuries to your feet that could lead to more serious problems. Noticing and addressing any potential problems early is the best way to prevent future foot problems. How to care for your feet Foot hygiene  Wash your feet daily with warm water and mild soap. Do not use hot water. Then, pat your feet and the areas between your toes until they are completely dry. Do not soak your feet as this can dry your skin.  Trim your toenails straight across. Do not dig under them or around the cuticle. File the edges of your nails with an emery board or nail file.  Apply a moisturizing lotion or petroleum jelly to the skin on your feet and to dry, brittle toenails. Use lotion that does not contain alcohol and is unscented. Do not apply lotion between your toes. Shoes and socks  Wear clean socks or stockings every day. Make sure they are not too tight. Do not wear knee-high stockings since they may decrease blood flow to your legs.  Wear shoes that fit properly and have enough cushioning. Always look in your shoes before you put them on to be sure there are no objects inside.  To break in new shoes, wear them for just a few hours a day. This prevents injuries on your feet. Wounds, scrapes, corns, and calluses  Check your feet daily for blisters, cuts, bruises, sores, and redness. If you cannot see the bottom of your feet, use a mirror or ask someone for help.  Do not cut corns or calluses or try to remove them with medicine.  If you  find a minor scrape, cut, or break in the skin on your feet, keep it and the skin around it clean and dry. You may clean these areas with mild soap and water. Do not clean the area with peroxide, alcohol, or iodine.  If you have a wound, scrape, corn, or callus on your foot, look at it several times a day to make sure it is healing and not infected. Check for: ? Redness, swelling, or pain. ? Fluid or blood. ? Warmth. ? Pus or a bad smell. General instructions  Do not cross your legs. This may decrease blood flow to your feet.  Do not use heating pads or hot water bottles on your feet. They may burn your skin. If you have lost feeling in your feet or legs, you may not know this is happening until it is too late.  Protect your feet from hot and cold by wearing shoes, such as at the beach or on hot pavement.  Schedule a complete foot exam at least once a year (annually) or more often if you have foot problems. If you have foot problems, report any cuts, sores, or bruises to your health care provider immediately. Contact a health care provider if:  You have a medical condition that increases your risk of infection and you have any cuts, sores, or bruises on your feet.  You have an injury that is not   healing.  You have redness on your legs or feet.  You feel burning or tingling in your legs or feet.  You have pain or cramps in your legs and feet.  Your legs or feet are numb.  Your feet always feel cold.  You have pain around a toenail. Get help right away if:  You have a wound, scrape, corn, or callus on your foot and: ? You have pain, swelling, or redness that gets worse. ? You have fluid or blood coming from the wound, scrape, corn, or callus. ? Your wound, scrape, corn, or callus feels warm to the touch. ? You have pus or a bad smell coming from the wound, scrape, corn, or callus. ? You have a fever. ? You have a red line going up your leg. Summary  Check your feet every day  for cuts, sores, red spots, swelling, and blisters.  Moisturize feet and legs daily.  Wear shoes that fit properly and have enough cushioning.  If you have foot problems, report any cuts, sores, or bruises to your health care provider immediately.  Schedule a complete foot exam at least once a year (annually) or more often if you have foot problems. This information is not intended to replace advice given to you by your health care provider. Make sure you discuss any questions you have with your health care provider. Document Released: 03/12/2000 Document Revised: 04/27/2017 Document Reviewed: 04/16/2016 Elsevier Patient Education  2020 [employer]  

## 2018-11-02 LAB — T4, FREE: Free T4: 2.28 ng/dL — ABNORMAL HIGH (ref 0.82–1.77)

## 2018-11-02 LAB — HEMOGLOBIN A1C
Est. average glucose Bld gHb Est-mCnc: 390 mg/dL
Hgb A1c MFr Bld: 15.2 % — ABNORMAL HIGH (ref 4.8–5.6)

## 2018-11-02 LAB — CMP14+EGFR
ALT: 30 IU/L (ref 0–32)
AST: 21 IU/L (ref 0–40)
Albumin/Globulin Ratio: 1.5 (ref 1.2–2.2)
Albumin: 4.3 g/dL (ref 3.8–4.9)
Alkaline Phosphatase: 103 IU/L (ref 39–117)
BUN/Creatinine Ratio: 25 — ABNORMAL HIGH (ref 9–23)
BUN: 44 mg/dL — ABNORMAL HIGH (ref 6–24)
Bilirubin Total: 0.4 mg/dL (ref 0.0–1.2)
CO2: 27 mmol/L (ref 20–29)
Calcium: 10.9 mg/dL — ABNORMAL HIGH (ref 8.7–10.2)
Chloride: 94 mmol/L — ABNORMAL LOW (ref 96–106)
Creatinine, Ser: 1.76 mg/dL — ABNORMAL HIGH (ref 0.57–1.00)
GFR calc Af Amer: 36 mL/min/{1.73_m2} — ABNORMAL LOW (ref >59)
GFR calc non Af Amer: 32 mL/min/{1.73_m2} — ABNORMAL LOW (ref >59)
Globulin, Total: 2.8 g/dL (ref 1.5–4.5)
Glucose: 176 mg/dL — ABNORMAL HIGH (ref 65–99)
Potassium: 4.7 mmol/L (ref 3.5–5.2)
Sodium: 140 mmol/L (ref 134–144)
Total Protein: 7.1 g/dL (ref 6.0–8.5)

## 2018-11-02 LAB — CBC
Hematocrit: 36.5 % (ref 34.0–46.6)
Hemoglobin: 11.8 g/dL (ref 11.1–15.9)
MCH: 26.9 pg (ref 26.6–33.0)
MCHC: 32.3 g/dL (ref 31.5–35.7)
MCV: 83 fL (ref 79–97)
Platelets: 421 10*3/uL (ref 150–450)
RBC: 4.38 x10E6/uL (ref 3.77–5.28)
RDW: 14.4 % (ref 11.7–15.4)
WBC: 11.9 10*3/uL — ABNORMAL HIGH (ref 3.4–10.8)

## 2018-11-02 LAB — LIPID PANEL
Chol/HDL Ratio: 3.2 ratio (ref 0.0–4.4)
Cholesterol, Total: 109 mg/dL (ref 100–199)
HDL: 34 mg/dL — ABNORMAL LOW (ref >39)
LDL Calculated: 52 mg/dL (ref 0–99)
Triglycerides: 114 mg/dL (ref 0–149)
VLDL Cholesterol Cal: 23 mg/dL (ref 5–40)

## 2018-11-02 LAB — TSH: TSH: 0.732 u[IU]/mL (ref 0.450–4.500)

## 2018-11-02 MED FILL — FLUCONAZOLE 150 MG TABS: 150 | 1 days supply | Qty: 1 | Fill #0

## 2018-11-07 ENCOUNTER — Other Ambulatory Visit: Payer: Self-pay | Admitting: Internal Medicine

## 2018-11-07 LAB — PROTEIN ELECTROPHORESIS
A/G Ratio: 1 (ref 0.7–1.7)
Albumin ELP: 3.5 g/dL (ref 2.9–4.4)
Alpha 1: 0.2 g/dL (ref 0.0–0.4)
Alpha 2: 1.1 g/dL — ABNORMAL HIGH (ref 0.4–1.0)
Beta: 1.2 g/dL (ref 0.7–1.3)
Gamma Globulin: 0.9 g/dL (ref 0.4–1.8)
Globulin, Total: 3.5 g/dL (ref 2.2–3.9)
Total Protein: 7 g/dL (ref 6.0–8.5)

## 2018-11-07 LAB — SPECIMEN STATUS REPORT

## 2018-11-07 MED FILL — OZEMPIC 0.25 OR 0.5 MG/DOSE: 2 | 84 days supply | Qty: 5 | Fill #0

## 2018-11-20 ENCOUNTER — Telehealth: Payer: Self-pay

## 2018-11-20 LAB — HM DIABETES EYE EXAM

## 2018-11-20 NOTE — Telephone Encounter (Signed)
I left the pt a message that her samples of [place] is ready for pickup and does she want to speak with the pharmacist from [facility] that works the office to see if she can get some help with the cost of some of her medications.

## 2018-11-22 ENCOUNTER — Other Ambulatory Visit: Payer: Self-pay | Admitting: Cardiology

## 2018-11-22 ENCOUNTER — Other Ambulatory Visit: Payer: Self-pay

## 2018-11-22 MED ORDER — FUROSEMIDE 40 MG PO TABS: 40.0000 mg | ORAL_TABLET | Freq: Two times a day (BID) | ORAL | 1 refills | Status: AC

## 2018-11-22 MED FILL — FUROSEMIDE 40 MG TAB: 40 | 90 days supply | Qty: 180 | Fill #0

## 2018-11-22 MED FILL — SYNTHROID 200 MCG TABLET: 200 | 90 days supply | Qty: 90 | Fill #1

## 2018-11-22 MED FILL — OMEPRAZOLE DR 40 MG CAPSULE: 40 | 30 days supply | Qty: 30 | Fill #3

## 2018-11-23 ENCOUNTER — Encounter: Payer: Self-pay | Admitting: Internal Medicine

## 2018-11-24 ENCOUNTER — Ambulatory Visit: Payer: Medicare PPO | Admitting: Internal Medicine

## 2018-12-11 ENCOUNTER — Other Ambulatory Visit: Payer: Self-pay

## 2018-12-11 MED ORDER — TRESIBA FLEXTOUCH 200 UNIT/ML ~~LOC~~ SOPN: 43.0000 [IU] | PEN_INJECTOR | Freq: Every day | SUBCUTANEOUS | 2 refills | Status: AC

## 2018-12-13 ENCOUNTER — Other Ambulatory Visit: Payer: Self-pay

## 2018-12-13 ENCOUNTER — Other Ambulatory Visit: Payer: Medicare PPO

## 2018-12-13 DIAGNOSIS — E039 Hypothyroidism, unspecified: Secondary | ICD-10-CM

## 2018-12-13 DIAGNOSIS — Z1231 Encounter for screening mammogram for malignant neoplasm of breast: Secondary | ICD-10-CM

## 2018-12-14 LAB — T4, FREE: Free T4: 2.01 ng/dL — ABNORMAL HIGH (ref 0.82–1.77)

## 2018-12-14 LAB — TSH: TSH: 0.404 u[IU]/mL — ABNORMAL LOW (ref 0.450–4.500)

## 2019-01-03 ENCOUNTER — Other Ambulatory Visit: Payer: Self-pay

## 2019-01-03 MED ORDER — POTASSIUM CHLORIDE CRYS ER 10 MEQ PO TBCR: 10.0000 meq | EXTENDED_RELEASE_TABLET | Freq: Every day | ORAL | 0 refills | Status: AC

## 2019-01-03 MED ORDER — ROSUVASTATIN CALCIUM 20 MG PO TABS: 20.0000 mg | ORAL_TABLET | Freq: Every day | ORAL | 0 refills | Status: AC

## 2019-01-03 MED ORDER — METFORMIN HCL 1000 MG PO TABS: ORAL_TABLET | ORAL | 0 refills | Status: DC

## 2019-01-15 ENCOUNTER — Other Ambulatory Visit: Payer: Self-pay

## 2019-01-15 MED ORDER — CARVEDILOL 6.25 MG PO TABS: 6.2500 mg | ORAL_TABLET | Freq: Two times a day (BID) | ORAL | 0 refills | Status: AC

## 2019-01-17 ENCOUNTER — Telehealth: Payer: Self-pay | Admitting: Internal Medicine

## 2019-01-17 NOTE — Telephone Encounter (Signed)
FYI-Patient has moved to [place] and will no longer be seen by Dr. Cruzita Lederer.

## 2019-01-19 ENCOUNTER — Ambulatory Visit: Payer: Medicare PPO | Admitting: Internal Medicine

## 2019-01-24 ENCOUNTER — Ambulatory Visit: Payer: Medicare PPO | Admitting: Cardiology

## 2019-02-02 DIAGNOSIS — I10 Essential (primary) hypertension: Secondary | ICD-10-CM | POA: Diagnosis not present

## 2019-02-02 DIAGNOSIS — M545 Low back pain: Secondary | ICD-10-CM | POA: Diagnosis not present

## 2019-02-02 DIAGNOSIS — I2510 Atherosclerotic heart disease of native coronary artery without angina pectoris: Secondary | ICD-10-CM | POA: Diagnosis not present

## 2019-02-02 DIAGNOSIS — E1142 Type 2 diabetes mellitus with diabetic polyneuropathy: Secondary | ICD-10-CM | POA: Diagnosis not present

## 2019-02-07 DIAGNOSIS — E1142 Type 2 diabetes mellitus with diabetic polyneuropathy: Secondary | ICD-10-CM | POA: Diagnosis not present

## 2019-02-07 DIAGNOSIS — Z794 Long term (current) use of insulin: Secondary | ICD-10-CM | POA: Diagnosis not present

## 2019-02-07 DIAGNOSIS — I10 Essential (primary) hypertension: Secondary | ICD-10-CM | POA: Diagnosis not present

## 2019-02-07 DIAGNOSIS — I2510 Atherosclerotic heart disease of native coronary artery without angina pectoris: Secondary | ICD-10-CM | POA: Diagnosis not present

## 2019-02-07 DIAGNOSIS — E7849 Other hyperlipidemia: Secondary | ICD-10-CM | POA: Diagnosis not present

## 2019-02-07 DIAGNOSIS — E890 Postprocedural hypothyroidism: Secondary | ICD-10-CM | POA: Diagnosis not present

## 2019-02-07 DIAGNOSIS — E119 Type 2 diabetes mellitus without complications: Secondary | ICD-10-CM | POA: Diagnosis not present

## 2019-02-07 DIAGNOSIS — M545 Low back pain: Secondary | ICD-10-CM | POA: Diagnosis not present

## 2019-02-07 DIAGNOSIS — R7989 Other specified abnormal findings of blood chemistry: Secondary | ICD-10-CM | POA: Diagnosis not present

## 2019-02-16 DIAGNOSIS — I10 Essential (primary) hypertension: Secondary | ICD-10-CM | POA: Diagnosis not present

## 2019-02-16 DIAGNOSIS — Z9119 Patient's noncompliance with other medical treatment and regimen: Secondary | ICD-10-CM | POA: Diagnosis not present

## 2019-02-16 DIAGNOSIS — Z6839 Body mass index (BMI) 39.0-39.9, adult: Secondary | ICD-10-CM | POA: Diagnosis not present

## 2019-02-16 DIAGNOSIS — E119 Type 2 diabetes mellitus without complications: Secondary | ICD-10-CM | POA: Diagnosis not present

## 2019-02-20 ENCOUNTER — Other Ambulatory Visit: Payer: Self-pay | Admitting: Internal Medicine

## 2019-02-28 ENCOUNTER — Ambulatory Visit: Payer: Medicare PPO | Admitting: Internal Medicine

## 2019-02-28 ENCOUNTER — Ambulatory Visit: Payer: Medicare PPO

## 2019-02-28 DIAGNOSIS — R9439 Abnormal result of other cardiovascular function study: Secondary | ICD-10-CM | POA: Diagnosis not present

## 2019-02-28 DIAGNOSIS — I209 Angina pectoris, unspecified: Secondary | ICD-10-CM | POA: Diagnosis not present

## 2019-02-28 DIAGNOSIS — I2510 Atherosclerotic heart disease of native coronary artery without angina pectoris: Secondary | ICD-10-CM | POA: Diagnosis not present

## 2019-02-28 DIAGNOSIS — R0600 Dyspnea, unspecified: Secondary | ICD-10-CM | POA: Diagnosis not present

## 2019-02-28 DIAGNOSIS — N183 Chronic kidney disease, stage 3 unspecified: Secondary | ICD-10-CM | POA: Diagnosis not present

## 2019-02-28 DIAGNOSIS — I10 Essential (primary) hypertension: Secondary | ICD-10-CM | POA: Diagnosis not present

## 2019-03-06 DIAGNOSIS — I10 Essential (primary) hypertension: Secondary | ICD-10-CM | POA: Diagnosis not present

## 2019-04-05 DIAGNOSIS — E119 Type 2 diabetes mellitus without complications: Secondary | ICD-10-CM | POA: Diagnosis not present

## 2019-04-05 DIAGNOSIS — E1142 Type 2 diabetes mellitus with diabetic polyneuropathy: Secondary | ICD-10-CM | POA: Diagnosis not present

## 2019-04-05 DIAGNOSIS — I10 Essential (primary) hypertension: Secondary | ICD-10-CM | POA: Diagnosis not present

## 2019-04-05 DIAGNOSIS — Z6841 Body Mass Index (BMI) 40.0 and over, adult: Secondary | ICD-10-CM | POA: Diagnosis not present

## 2019-04-09 DIAGNOSIS — Z20822 Contact with and (suspected) exposure to covid-19: Secondary | ICD-10-CM | POA: Diagnosis not present

## 2019-05-09 DIAGNOSIS — I10 Essential (primary) hypertension: Secondary | ICD-10-CM | POA: Diagnosis not present

## 2019-05-09 DIAGNOSIS — Z6839 Body mass index (BMI) 39.0-39.9, adult: Secondary | ICD-10-CM | POA: Diagnosis not present

## 2019-05-09 DIAGNOSIS — E890 Postprocedural hypothyroidism: Secondary | ICD-10-CM | POA: Diagnosis not present

## 2019-05-09 DIAGNOSIS — N1832 Chronic kidney disease, stage 3b: Secondary | ICD-10-CM | POA: Diagnosis not present

## 2019-05-09 DIAGNOSIS — Z794 Long term (current) use of insulin: Secondary | ICD-10-CM | POA: Diagnosis not present

## 2019-05-09 DIAGNOSIS — E119 Type 2 diabetes mellitus without complications: Secondary | ICD-10-CM | POA: Diagnosis not present

## 2019-05-09 DIAGNOSIS — M25562 Pain in left knee: Secondary | ICD-10-CM | POA: Diagnosis not present

## 2019-05-09 DIAGNOSIS — G8929 Other chronic pain: Secondary | ICD-10-CM | POA: Diagnosis not present

## 2019-05-09 DIAGNOSIS — M25561 Pain in right knee: Secondary | ICD-10-CM | POA: Diagnosis not present

## 2019-06-20 DIAGNOSIS — E890 Postprocedural hypothyroidism: Secondary | ICD-10-CM | POA: Diagnosis not present

## 2019-06-20 DIAGNOSIS — Z794 Long term (current) use of insulin: Secondary | ICD-10-CM | POA: Diagnosis not present

## 2019-06-20 DIAGNOSIS — E119 Type 2 diabetes mellitus without complications: Secondary | ICD-10-CM | POA: Diagnosis not present

## 2019-06-20 DIAGNOSIS — Z6839 Body mass index (BMI) 39.0-39.9, adult: Secondary | ICD-10-CM | POA: Diagnosis not present

## 2019-07-25 DIAGNOSIS — L02213 Cutaneous abscess of chest wall: Secondary | ICD-10-CM | POA: Diagnosis not present

## 2019-07-31 MED ORDER — GENERIC EXTERNAL MEDICATION
6.00 | Status: DC
Start: ? — End: 2019-07-31

## 2019-07-31 MED ORDER — PANTOPRAZOLE SODIUM 40 MG PO TBEC
40.00 | DELAYED_RELEASE_TABLET | ORAL | Status: DC
Start: 2019-07-31 — End: 2019-07-31

## 2019-07-31 MED ORDER — GENERIC EXTERNAL MEDICATION
175.00 | Status: DC
Start: 2019-07-31 — End: 2019-07-31

## 2019-07-31 MED ORDER — GENERIC EXTERNAL MEDICATION
Status: DC
Start: ? — End: 2019-07-31

## 2019-07-31 MED ORDER — CARVEDILOL 6.25 MG PO TABS
6.25 | ORAL_TABLET | ORAL | Status: DC
Start: 2019-07-30 — End: 2019-07-31

## 2019-07-31 MED ORDER — INSULIN LISPRO 100 UNIT/ML ~~LOC~~ SOLN
0.00 | SUBCUTANEOUS | Status: DC
Start: 2019-07-30 — End: 2019-07-31

## 2019-07-31 MED ORDER — ISOSORBIDE MONONITRATE ER 30 MG PO TB24
30.00 | ORAL_TABLET | ORAL | Status: DC
Start: 2019-07-31 — End: 2019-07-31

## 2019-07-31 MED ORDER — DEXTROSE 50 % IV SOLN
50.00 | INTRAVENOUS | Status: DC
Start: ? — End: 2019-07-31

## 2019-07-31 MED ORDER — GLUCAGON (RDNA) 1 MG IJ KIT
1.00 | PACK | INTRAMUSCULAR | Status: DC
Start: ? — End: 2019-07-31

## 2019-07-31 MED ORDER — INSULIN DETEMIR 100 UNIT/ML ~~LOC~~ SOLN
25.00 | SUBCUTANEOUS | Status: DC
Start: 2019-07-30 — End: 2019-07-31

## 2019-07-31 MED ORDER — HYDROCODONE-ACETAMINOPHEN 5-325 MG PO TABS
1.00 | ORAL_TABLET | ORAL | Status: DC
Start: ? — End: 2019-07-31

## 2019-07-31 MED ORDER — GABAPENTIN 300 MG PO CAPS
300.00 | ORAL_CAPSULE | ORAL | Status: DC
Start: 2019-07-30 — End: 2019-07-31

## 2019-07-31 MED ORDER — HEPARIN SODIUM (PORCINE) 5000 UNIT/ML IJ SOLN
5000.00 | INTRAMUSCULAR | Status: DC
Start: 2019-07-30 — End: 2019-07-31

## 2019-07-31 MED ORDER — ASPIRIN 81 MG PO TBEC
81.00 | DELAYED_RELEASE_TABLET | ORAL | Status: DC
Start: 2019-07-31 — End: 2019-07-31

## 2019-07-31 MED ORDER — MORPHINE SULFATE 2 MG/ML IJ SOLN
1.00 | INTRAMUSCULAR | Status: DC
Start: ? — End: 2019-07-31

## 2019-07-31 MED ORDER — DOXYCYCLINE HYCLATE 100 MG PO TABS
100.00 | ORAL_TABLET | ORAL | Status: DC
Start: 2019-07-30 — End: 2019-07-31

## 2019-07-31 MED ORDER — GLUCOSE-VITAMIN C 4-6 GM-MG PO CHEW
16.00 | CHEWABLE_TABLET | ORAL | Status: DC
Start: ? — End: 2019-07-31

## 2019-07-31 MED ORDER — FLUTICASONE PROPIONATE 50 MCG/ACT NA SUSP
2.00 | NASAL | Status: DC
Start: 2019-07-31 — End: 2019-07-31

## 2021-07-06 ENCOUNTER — Other Ambulatory Visit: Payer: Self-pay | Admitting: Internal Medicine

## 2021-07-20 ENCOUNTER — Other Ambulatory Visit: Payer: Self-pay | Admitting: Internal Medicine

## 2022-02-09 ENCOUNTER — Other Ambulatory Visit (HOSPITAL_COMMUNITY): Payer: Self-pay

## 2022-02-16 ENCOUNTER — Other Ambulatory Visit (HOSPITAL_COMMUNITY): Payer: Self-pay

## 2022-09-10 ENCOUNTER — Other Ambulatory Visit (HOSPITAL_COMMUNITY): Payer: Self-pay

## 2022-10-05 ENCOUNTER — Other Ambulatory Visit: Payer: Self-pay

## 2022-11-15 ENCOUNTER — Other Ambulatory Visit (HOSPITAL_COMMUNITY): Payer: Self-pay

## 2023-02-01 ENCOUNTER — Encounter: Payer: Self-pay | Admitting: Gastroenterology
